# Patient Record
Sex: Female | Born: 1985
Health system: Southern US, Community
[De-identification: ages and names within clinical notes are randomized; demographics above are authoritative.]

## PROBLEM LIST (undated history)

## (undated) DIAGNOSIS — G43909 Migraine, unspecified, not intractable, without status migrainosus: Secondary | ICD-10-CM

## (undated) HISTORY — PX: TUBAL LIGATION: SHX77

---

## 2003-10-18 ENCOUNTER — Emergency Department (HOSPITAL_COMMUNITY): Admission: EM | Admit: 2003-10-18 | Discharge: 2003-10-18 | Payer: Self-pay | Admitting: Emergency Medicine

## 2010-12-11 ENCOUNTER — Emergency Department (HOSPITAL_COMMUNITY): Payer: No Typology Code available for payment source

## 2010-12-11 ENCOUNTER — Emergency Department (HOSPITAL_COMMUNITY)
Admission: EM | Admit: 2010-12-11 | Discharge: 2010-12-11 | Disposition: A | Discharge status: Home / Self Care | Payer: No Typology Code available for payment source | Attending: Emergency Medicine | Admitting: Emergency Medicine

## 2010-12-11 DIAGNOSIS — S8010XA Contusion of unspecified lower leg, initial encounter: Secondary | ICD-10-CM | POA: Insufficient documentation

## 2010-12-11 DIAGNOSIS — Y998 Other external cause status: Secondary | ICD-10-CM | POA: Insufficient documentation

## 2010-12-11 DIAGNOSIS — S40029A Contusion of unspecified upper arm, initial encounter: Secondary | ICD-10-CM | POA: Insufficient documentation

## 2010-12-11 DIAGNOSIS — Y92410 Unspecified street and highway as the place of occurrence of the external cause: Secondary | ICD-10-CM | POA: Insufficient documentation

## 2010-12-11 DIAGNOSIS — S1093XA Contusion of unspecified part of neck, initial encounter: Secondary | ICD-10-CM | POA: Insufficient documentation

## 2010-12-11 DIAGNOSIS — S0003XA Contusion of scalp, initial encounter: Secondary | ICD-10-CM | POA: Insufficient documentation

## 2011-08-12 ENCOUNTER — Emergency Department (HOSPITAL_BASED_OUTPATIENT_CLINIC_OR_DEPARTMENT_OTHER)
Admission: EM | Admit: 2011-08-12 | Discharge: 2011-08-12 | Disposition: A | Discharge status: Home Health | Payer: Medicaid Other | Attending: Emergency Medicine | Admitting: Emergency Medicine

## 2011-08-12 ENCOUNTER — Encounter (HOSPITAL_BASED_OUTPATIENT_CLINIC_OR_DEPARTMENT_OTHER): Payer: Self-pay | Admitting: *Deleted

## 2011-08-12 ENCOUNTER — Emergency Department (HOSPITAL_BASED_OUTPATIENT_CLINIC_OR_DEPARTMENT_OTHER): Payer: Medicaid Other

## 2011-08-12 DIAGNOSIS — F17200 Nicotine dependence, unspecified, uncomplicated: Secondary | ICD-10-CM | POA: Insufficient documentation

## 2011-08-12 DIAGNOSIS — R070 Pain in throat: Secondary | ICD-10-CM | POA: Insufficient documentation

## 2011-08-12 DIAGNOSIS — R059 Cough, unspecified: Secondary | ICD-10-CM

## 2011-08-12 DIAGNOSIS — R05 Cough: Secondary | ICD-10-CM | POA: Insufficient documentation

## 2011-08-12 DIAGNOSIS — J029 Acute pharyngitis, unspecified: Secondary | ICD-10-CM

## 2011-08-12 HISTORY — DX: Migraine, unspecified, not intractable, without status migrainosus: G43.909

## 2011-08-12 LAB — PREGNANCY, URINE: Preg Test, Ur: NEGATIVE

## 2011-08-12 MED ORDER — HYDROCOD POLST-CHLORPHEN POLST 10-8 MG/5ML PO LQCR: 5.0000 mL | Freq: Two times a day (BID) | ORAL | Status: DC | PRN

## 2011-08-12 NOTE — ED Provider Notes (Signed)
History     CSN: [id number]  Arrival date & time 08/12/11  1437   First MD Initiated Contact with Patient 08/12/11 1521      Chief Complaint  Patient presents with  . Cough  . Sore Throat    (Consider location/radiation/quality/duration/timing/severity/associated sxs/prior treatment) Patient is a 26 y.o. female presenting with cough. The history is provided by the patient. No language interpreter was used.  Cough This is a new problem. The current episode started more than 2 days ago. The problem has not changed since onset.The cough is productive of sputum. The maximum temperature recorded prior to her arrival was 100 to 100.9 F. Associated symptoms include rhinorrhea and sore throat. Pertinent negatives include no shortness of breath and no wheezing. Treatments tried: claritin. The treatment provided no relief. She is a smoker.    Past Medical History  Diagnosis Date  . Migraines     Past Surgical History  Procedure Date  . Cesarean section   . Tubal ligation     History reviewed. No pertinent family history.  History  Substance Use Topics  . Smoking status: Current Everyday Smoker  . Smokeless tobacco: Not on file  . Alcohol Use: No    OB History    Grav Para Term Preterm Abortions TAB SAB Ect Mult Living                  Review of Systems  Constitutional: Negative.   HENT: Positive for sore throat and rhinorrhea.   Eyes: Negative.   Respiratory: Positive for cough. Negative for shortness of breath and wheezing.   Cardiovascular: Negative.     Allergies  Levaquin  Home Medications  No current outpatient prescriptions on file.  BP 121/82  Pulse 88  Temp 98.5 F (36.9 C) (Oral)  Resp 20  Ht 4\' 11"  (1.499 m)  Wt 170 lb (77.111 kg)  BMI 34.34 kg/m2  SpO2 99%  LMP 07/24/2011  Physical Exam  Nursing note and vitals reviewed. Constitutional: She appears well-developed and well-nourished.  HENT:  Right Ear: External ear normal.  Left Ear:  External ear normal.  Mouth/Throat: Posterior oropharyngeal erythema present. No oropharyngeal exudate or posterior oropharyngeal edema.  Eyes: Conjunctivae and EOM are normal.  Neck: Neck supple.  Cardiovascular: Normal rate and regular rhythm.   Pulmonary/Chest: Breath sounds normal.  Musculoskeletal: Normal range of motion.    ED Course  Procedures (including critical care time)   Labs Reviewed  PREGNANCY, URINE   Dg Chest 2 View  08/12/2011  *RADIOLOGY REPORT*  Clinical Data: Cough  CHEST - 2 VIEW  Comparison: None  Findings: The heart size and mediastinal contours are within normal limits.  Both lungs are clear.  The visualized skeletal structures are unremarkable.  IMPRESSION: Negative exam.  Original Report Authenticated By: Rosealee Albee, M.D.     1. Cough   2. Sore throat       MDM  Symptoms likely viral:don't think antibiotics are needed at this time:will send home with symptomatic relief       Teressa Lower, NP 08/12/11 1651

## 2011-08-12 NOTE — ED Provider Notes (Signed)
History/physical exam/procedure(s) were performed by non-physician practitioner and as supervising physician I was immediately available for consultation/collaboration. I have reviewed all notes and am in agreement with care and plan.   Hazel Wrinkle S Jream Broyles, MD 08/12/11 1751 

## 2011-08-12 NOTE — Discharge Instructions (Signed)
Antibiotic Nonuse  Your caregiver felt that the infection or problem was not one that would be helped with an antibiotic. Infections may be caused by viruses or bacteria. Only a caregiver can tell which one of these is the likely cause of an illness. A cold is the most common cause of infection in both adults and children. A cold is a virus. Antibiotic treatment will have no effect on a viral infection. Viruses can lead to many lost days of work caring for sick children and many missed days of school. Children may catch as many as 10 "colds" or "flus" per year during which they can be tearful, cranky, and uncomfortable. The goal of treating a virus is aimed at keeping the ill person comfortable. Antibiotics are medications used to help the body fight bacterial infections. There are relatively few types of bacteria that cause infections but there are hundreds of viruses. While both viruses and bacteria cause infection they are very different types of germs. A viral infection will typically go away by itself within 7 to 10 days. Bacterial infections may spread or get worse without antibiotic treatment. Examples of bacterial infections are:  Sore throats (like strep throat or tonsillitis).   Infection in the lung (pneumonia).   Ear and skin infections.  Examples of viral infections are:  Colds or flus.   Most coughs and bronchitis.   Sore throats not caused by Strep.   Runny noses.  It is often best not to take an antibiotic when a viral infection is the cause of the problem. Antibiotics can kill off the helpful bacteria that we have inside our body and allow harmful bacteria to start growing. Antibiotics can cause side effects such as allergies, nausea, and diarrhea without helping to improve the symptoms of the viral infection. Additionally, repeated uses of antibiotics can cause bacteria inside of our body to become resistant. That resistance can be passed onto harmful bacterial. The next time  you have an infection it may be harder to treat if antibiotics are used when they are not needed. Not treating with antibiotics allows our own immune system to develop and take care of infections more efficiently. Also, antibiotics will work better for us when they are prescribed for bacterial infections. Treatments for a child that is ill may include:  Give extra fluids throughout the day to stay hydrated.   Get plenty of rest.   Only give your child over-the-counter or prescription medicines for pain, discomfort, or fever as directed by your caregiver.   The use of a cool mist humidifier may help stuffy noses.   Cold medications if suggested by your caregiver.  Your caregiver may decide to start you on an antibiotic if:  The problem you were seen for today continues for a longer length of time than expected.   You develop a secondary bacterial infection.  SEEK MEDICAL CARE IF:  Fever lasts longer than 5 days.   Symptoms continue to get worse after 5 to 7 days or become severe.   Difficulty in breathing develops.   Signs of dehydration develop (poor drinking, rare urinating, dark colored urine).   Changes in behavior or worsening tiredness (listlessness or lethargy).  Document Released: 04/10/2001 Document Revised: 01/19/2011 Document Reviewed: 10/07/2008 Exit[employer] Patient Information 2012 [employer].Cough, Adult  A cough is a reflex that helps clear your throat and airways. It can help heal the body or may be a reaction to an irritated airway. A cough may only last 2 or 3   weeks (acute) or may last more than 8 weeks (chronic).  CAUSES Acute cough:  Viral or bacterial infections.  Chronic cough:  Infections.   Allergies.   Asthma.   Post-nasal drip.   Smoking.   Heartburn or acid reflux.   Some medicines.   Chronic lung problems (COPD).   Cancer.  SYMPTOMS   Cough.   Fever.   Chest pain.   Increased breathing rate.   High-pitched whistling sound  when breathing (wheezing).   Colored mucus that you cough up (sputum).  TREATMENT   A bacterial cough may be treated with antibiotic medicine.   A viral cough must run its course and will not respond to antibiotics.   Your caregiver may recommend other treatments if you have a chronic cough.  HOME CARE INSTRUCTIONS   Only take over-the-counter or prescription medicines for pain, discomfort, or fever as directed by your caregiver. Use cough suppressants only as directed by your caregiver.   Use a cold steam vaporizer or humidifier in your bedroom or home to help loosen secretions.   Sleep in a semi-upright position if your cough is worse at night.   Rest as needed.   Stop smoking if you smoke.  SEEK IMMEDIATE MEDICAL CARE IF:   You have pus in your sputum.   Your cough starts to worsen.   You cannot control your cough with suppressants and are losing sleep.   You begin coughing up blood.   You have difficulty breathing.   You develop pain which is getting worse or is uncontrolled with medicine.   You have a fever.  MAKE SURE YOU:   Understand these instructions.   Will watch your condition.   Will get help right away if you are not doing well or get worse.  Document Released: 07/29/2010 Document Revised: 01/19/2011 Document Reviewed: 07/29/2010 [employer] Patient Information 2012 [employer]. 

## 2011-08-12 NOTE — ED Notes (Signed)
Pt states she has had cold s/s x 3-4 days. Hurts to swallow. Prod cough with clr sputum.

## 2011-10-04 ENCOUNTER — Emergency Department (HOSPITAL_BASED_OUTPATIENT_CLINIC_OR_DEPARTMENT_OTHER)
Admission: EM | Admit: 2011-10-04 | Discharge: 2011-10-04 | Disposition: A | Discharge status: Home / Self Care | Payer: Medicaid Other | Attending: Emergency Medicine | Admitting: Emergency Medicine

## 2011-10-04 ENCOUNTER — Encounter (HOSPITAL_BASED_OUTPATIENT_CLINIC_OR_DEPARTMENT_OTHER): Payer: Self-pay

## 2011-10-04 ENCOUNTER — Emergency Department (HOSPITAL_BASED_OUTPATIENT_CLINIC_OR_DEPARTMENT_OTHER): Payer: Medicaid Other

## 2011-10-04 DIAGNOSIS — F17200 Nicotine dependence, unspecified, uncomplicated: Secondary | ICD-10-CM | POA: Insufficient documentation

## 2011-10-04 DIAGNOSIS — K5900 Constipation, unspecified: Secondary | ICD-10-CM | POA: Insufficient documentation

## 2011-10-04 LAB — URINALYSIS, ROUTINE W REFLEX MICROSCOPIC
Leukocytes, UA: NEGATIVE
Nitrite: NEGATIVE
Specific Gravity, Urine: 1.027 (ref 1.005–1.030)
Urobilinogen, UA: 0.2 mg/dL (ref 0.0–1.0)
pH: 6 (ref 5.0–8.0)

## 2011-10-04 LAB — PREGNANCY, URINE: Preg Test, Ur: NEGATIVE

## 2011-10-04 MED ORDER — POLYETHYLENE GLYCOL 3350 17 G PO PACK: 17.0000 g | PACK | Freq: Every day | ORAL | Status: AC

## 2011-10-04 NOTE — ED Notes (Signed)
abd pain, back pain, nausea x 1 week

## 2011-10-04 NOTE — ED Provider Notes (Signed)
History     CSN: [id number]  Arrival date & time 10/04/11  1558   First MD Initiated Contact with Patient 10/04/11 1708      Chief Complaint  Patient presents with  . Abdominal Pain    (Consider location/radiation/quality/duration/timing/severity/associated sxs/prior treatment) Patient is a 26 y.o. female presenting with abdominal pain. The history is provided by the patient. No language interpreter was used.  Abdominal Pain The primary symptoms of the illness include abdominal pain. Episode onset: 1 week. The onset of the illness was gradual. The problem has been gradually worsening.  The patient states that she believes she is currently not pregnant. The patient has had a change in bowel habit. Significant associated medical issues do not include PUD, GERD or diverticulitis.  Pt complains of lower back pain and abdominal pain.   Pt reports decreased bowel movements. Pt denies gyn complaints. No vomitting or diarrhea  Past Medical History  Diagnosis Date  . Migraines     Past Surgical History  Procedure Date  . Cesarean section   . Tubal ligation     No family history on file.  History  Substance Use Topics  . Smoking status: Current Everyday Smoker  . Smokeless tobacco: Not on file  . Alcohol Use: No    OB History    Grav Para Term Preterm Abortions TAB SAB Ect Mult Living                  Review of Systems  Gastrointestinal: Positive for abdominal pain.  All other systems reviewed and are negative.    Allergies  Levaquin  Home Medications   Current Outpatient Rx  Name Route Sig Dispense Refill  . CLARITIN PO Oral Take 1 tablet by mouth daily.      BP 114/77  Pulse 86  Temp 98.6 F (37 C) (Oral)  Resp 18  Ht 4\' 11"  (1.499 m)  Wt 165 lb (74.844 kg)  BMI 33.33 kg/m2  SpO2 98%  LMP 09/23/2011  Physical Exam  Nursing note and vitals reviewed. Constitutional: She is oriented to person, place, and time. She appears well-developed and  well-nourished.  HENT:  Head: Normocephalic and atraumatic.  Right Ear: External ear normal.  Left Ear: External ear normal.  Nose: Nose normal.  Mouth/Throat: Oropharynx is clear and moist.  Eyes: Conjunctivae and EOM are normal. Pupils are equal, round, and reactive to light.  Neck: Normal range of motion. Neck supple.  Cardiovascular: Normal rate and normal heart sounds.   Pulmonary/Chest: Effort normal.  Abdominal: Soft. Bowel sounds are normal.  Musculoskeletal: Normal range of motion.  Neurological: She is alert and oriented to person, place, and time.  Skin: Skin is warm.  Psychiatric: She has a normal mood and affect.    ED Course  Procedures (including critical care time)  Labs Reviewed  URINALYSIS, ROUTINE W REFLEX MICROSCOPIC - Abnormal; Notable for the following:    Color, Urine AMBER (*)  BIOCHEMICALS MAY BE AFFECTED BY COLOR   APPearance CLOUDY (*)     Bilirubin Urine SMALL (*)     Ketones, ur 15 (*)     All other components within normal limits  PREGNANCY, URINE   No results found.  I obtained a kub which shows constipation.   I counseled pt on results 1. Constipation       MDM         Elson Areas, [place] 10/04/11 2106

## 2011-10-06 NOTE — ED Provider Notes (Signed)
Medical screening examination/treatment/procedure(s) were performed by non-physician practitioner and as supervising physician I was immediately available for consultation/collaboration.  Neyland Pettengill, MD 10/06/11 0121 

## 2011-10-19 ENCOUNTER — Emergency Department (HOSPITAL_BASED_OUTPATIENT_CLINIC_OR_DEPARTMENT_OTHER)
Admission: EM | Admit: 2011-10-19 | Discharge: 2011-10-19 | Disposition: A | Discharge status: Home / Self Care | Payer: Medicaid Other | Attending: Emergency Medicine | Admitting: Emergency Medicine

## 2011-10-19 ENCOUNTER — Encounter (HOSPITAL_BASED_OUTPATIENT_CLINIC_OR_DEPARTMENT_OTHER): Payer: Self-pay | Admitting: *Deleted

## 2011-10-19 DIAGNOSIS — F17200 Nicotine dependence, unspecified, uncomplicated: Secondary | ICD-10-CM | POA: Insufficient documentation

## 2011-10-19 DIAGNOSIS — N644 Mastodynia: Secondary | ICD-10-CM

## 2011-10-19 MED ORDER — IBUPROFEN 800 MG PO TABS
800.0000 mg | ORAL_TABLET | Freq: Once | ORAL | Status: AC
  Administered 2011-10-19: 800 mg via ORAL
  Filled 2011-10-19: qty 1

## 2011-10-19 NOTE — ED Notes (Signed)
Pt declines w/c, amb to room 11 with quick steady gait smiling in nad. Pt reports one month of generalized bilateral breast "fullness, and they are sore, like when I'm about to start my period..." ekg performed while pt being triaged.

## 2011-10-19 NOTE — ED Provider Notes (Signed)
History     CSN: [id number]  Arrival date & time 10/19/11  7846   First MD Initiated Contact with Patient 10/19/11 [phone]      Chief Complaint  Patient presents with  . Breast Pain  . Chest Pain     Patient is a 26 y.o. female presenting with chest pain. The history is provided by the patient.  Chest Pain Episode onset: several weeks ago. Chest pain occurs intermittently. The chest pain is unchanged. Associated with: palpation of chest. The severity of the pain is mild. The quality of the pain is described as aching. The pain does not radiate. Chest pain is worsened by certain positions (palpation). Pertinent negatives for primary symptoms include no fever, no shortness of breath, no cough, no vomiting and no dizziness. She tried nothing for the symptoms.   pt presents with chest soreness for several weeks, now worse with recent nonproductive cough.  No dyspnea on exertion, no SOB at rest.  No syncope.  No fever.  Also reports bilateral breast tenderness, but no lumps reported.  No trauma reported  No h/o dvt/pe/cad She does smoke but denies OCP use No recent travel/surgery  Past Medical History  Diagnosis Date  . Migraines   . Migraines     Past Surgical History  Procedure Date  . Cesarean section   . Tubal ligation     History reviewed. No pertinent family history.  History  Substance Use Topics  . Smoking status: Current Everyday Smoker  . Smokeless tobacco: Not on file  . Alcohol Use: No    OB History    Grav Para Term Preterm Abortions TAB SAB Ect Mult Living                  Review of Systems  Constitutional: Negative for fever.  Respiratory: Negative for cough and shortness of breath.   Cardiovascular: Positive for chest pain.  Gastrointestinal: Negative for vomiting.  Neurological: Negative for dizziness.    Allergies  Levaquin  Home Medications   Current Outpatient Rx  Name Route Sig Dispense Refill  . CLARITIN PO Oral Take 1 tablet by mouth  daily.      BP 122/86  Pulse 79  Temp 98.2 F (36.8 C) (Oral)  Resp 18  Ht 4\' 11"  (1.499 m)  Wt 165 lb (74.844 kg)  BMI 33.33 kg/m2  SpO2 100%  LMP 09/23/2011  Physical Exam CONSTITUTIONAL: Well developed/well nourished HEAD AND FACE: Normocephalic/atraumatic EYES: EOMI/PERRL ENMT: Mucous membranes moist NECK: supple no meningeal signs SPINE:entire spine nontender CV: S1/S2 noted, no murmurs/rubs/gallops noted Chest  - mild tenderness noted just below left clavicle.  No deformity/bruising noted.  No crepitance Breast - no breast tenderness, no nipple discharge, no erythema or mass noted.  Breasts symmetric.  No deformity to either nipple  Chaperone present LUNGS: Lungs are clear to auscultation bilaterally, no apparent distress ABDOMEN: soft, nontender, no rebound or guarding GU:no cva tenderness NEURO: Pt is awake/alert, moves all extremitiesx4 EXTREMITIES: pulses normal, full ROM SKIN: warm, color normal PSYCH: no abnormalities of mood noted Lymph - no axillary lymphadenopathy ED Course  Procedures   1. Breast pain    I doubt ACS/PE/dissection (CP reproducible) Advised f/u with gynecologist for breast tenderness and advised formal breast exam by gynecologist   MDM  Nursing notes including past medical history and social history reviewed and considered in documentation Previous records reviewed and considered - recent negative pregnancy and recent negative CXR  Date: 10/19/2011  Rate: 85  Rhythm: normal sinus rhythm  QRS Axis: normal  Intervals: normal  ST/T Wave abnormalities: normal  Conduction Disutrbances:none     Joya Gaskins, MD 10/19/11 1213

## 2012-05-30 ENCOUNTER — Encounter (HOSPITAL_BASED_OUTPATIENT_CLINIC_OR_DEPARTMENT_OTHER): Payer: Self-pay | Admitting: Emergency Medicine

## 2012-05-30 ENCOUNTER — Emergency Department (HOSPITAL_BASED_OUTPATIENT_CLINIC_OR_DEPARTMENT_OTHER): Payer: Medicaid Other

## 2012-05-30 ENCOUNTER — Emergency Department (HOSPITAL_BASED_OUTPATIENT_CLINIC_OR_DEPARTMENT_OTHER)
Admission: EM | Admit: 2012-05-30 | Discharge: 2012-05-30 | Disposition: A | Discharge status: Home / Self Care | Payer: Medicaid Other | Attending: Emergency Medicine | Admitting: Emergency Medicine

## 2012-05-30 DIAGNOSIS — Z8679 Personal history of other diseases of the circulatory system: Secondary | ICD-10-CM | POA: Insufficient documentation

## 2012-05-30 DIAGNOSIS — J3489 Other specified disorders of nose and nasal sinuses: Secondary | ICD-10-CM | POA: Insufficient documentation

## 2012-05-30 DIAGNOSIS — R6889 Other general symptoms and signs: Secondary | ICD-10-CM | POA: Insufficient documentation

## 2012-05-30 DIAGNOSIS — R05 Cough: Secondary | ICD-10-CM | POA: Insufficient documentation

## 2012-05-30 DIAGNOSIS — R5381 Other malaise: Secondary | ICD-10-CM | POA: Insufficient documentation

## 2012-05-30 DIAGNOSIS — R059 Cough, unspecified: Secondary | ICD-10-CM | POA: Insufficient documentation

## 2012-05-30 DIAGNOSIS — R110 Nausea: Secondary | ICD-10-CM | POA: Insufficient documentation

## 2012-05-30 DIAGNOSIS — F17200 Nicotine dependence, unspecified, uncomplicated: Secondary | ICD-10-CM | POA: Insufficient documentation

## 2012-05-30 DIAGNOSIS — Z79899 Other long term (current) drug therapy: Secondary | ICD-10-CM | POA: Insufficient documentation

## 2012-05-30 DIAGNOSIS — R079 Chest pain, unspecified: Secondary | ICD-10-CM | POA: Insufficient documentation

## 2012-05-30 DIAGNOSIS — R0982 Postnasal drip: Secondary | ICD-10-CM | POA: Insufficient documentation

## 2012-05-30 DIAGNOSIS — J029 Acute pharyngitis, unspecified: Secondary | ICD-10-CM | POA: Insufficient documentation

## 2012-05-30 DIAGNOSIS — J069 Acute upper respiratory infection, unspecified: Secondary | ICD-10-CM

## 2012-05-30 MED ORDER — DEXAMETHASONE SODIUM PHOSPHATE 10 MG/ML IJ SOLN
10.0000 mg | Freq: Once | INTRAMUSCULAR | Status: AC
  Administered 2012-05-30: 10 mg via INTRAMUSCULAR
  Filled 2012-05-30: qty 1

## 2012-05-30 NOTE — ED Notes (Signed)
MD at bedside. 

## 2012-05-30 NOTE — ED Provider Notes (Signed)
History     CSN: [id number]  Arrival date & time 05/30/12  1722   First MD Initiated Contact with Patient 05/30/12 1734      Chief Complaint  Patient presents with  . Headache  . Nasal Congestion  . Chest Pain    (Consider location/radiation/quality/duration/timing/severity/associated sxs/prior treatment) HPI Comments: Patient has a two-week history of allergy type symptoms with clear rhinorrhea, sneezing and nasal congestion. Over the last 2 days she's been feeling worse with increased fatigue, sore throat and increased pressure in her sinuses. She also has a cough which is primarily nonproductive. She feels sore across her chest. She has no shortness of breath. She has no pleuritic type chest pain. She denies any leg pain or swelling. She denies he fevers or chills. She's had some nausea but no vomiting. She's been taking Allegra daily without relief.  Patient is a 27 y.o. female presenting with headaches and chest pain.  Headache Associated symptoms: congestion, cough, drainage, nausea and sore throat   Associated symptoms: no abdominal pain, no back pain, no diarrhea, no dizziness, no fatigue, no fever, no numbness and no vomiting   Chest Pain Associated symptoms: cough, headache and nausea   Associated symptoms: no abdominal pain, no back pain, no diaphoresis, no dizziness, no dysphagia, no fatigue, no fever, no numbness, no shortness of breath, not vomiting and no weakness     Past Medical History  Diagnosis Date  . Migraines   . Migraines     Past Surgical History  Procedure Laterality Date  . Cesarean section    . Tubal ligation      No family history on file.  History  Substance Use Topics  . Smoking status: Current Every Day Smoker -- 0.25 packs/day  . Smokeless tobacco: Not on file  . Alcohol Use: No    OB History   Grav Para Term Preterm Abortions TAB SAB Ect Mult Living                  Review of Systems  Constitutional: Negative for fever, chills,  diaphoresis and fatigue.  HENT: Positive for congestion, sore throat, rhinorrhea, sneezing and postnasal drip. Negative for trouble swallowing.   Eyes: Negative.   Respiratory: Positive for cough. Negative for chest tightness and shortness of breath.   Cardiovascular: Positive for chest pain. Negative for leg swelling.  Gastrointestinal: Positive for nausea. Negative for vomiting, abdominal pain, diarrhea and blood in stool.  Genitourinary: Negative for frequency, hematuria, flank pain and difficulty urinating.  Musculoskeletal: Negative for back pain and arthralgias.  Skin: Negative for rash.  Neurological: Positive for headaches. Negative for dizziness, speech difficulty, weakness and numbness.    Allergies  Levaquin  Home Medications   Current Outpatient Rx  Name  Route  Sig  Dispense  Refill  . fexofenadine (ALLEGRA) 180 MG tablet   Oral   Take 180 mg by mouth daily.         . Loratadine (CLARITIN PO)   Oral   Take 1 tablet by mouth daily.           BP 117/74  Pulse 75  Temp(Src) 98.9 F (37.2 C) (Oral)  Resp 16  Ht 4\' 11"  (1.499 m)  Wt 166 lb 9.6 oz (75.569 kg)  BMI 33.63 kg/m2  SpO2 98%  LMP 05/04/2012  Physical Exam  Constitutional: She is oriented to person, place, and time. She appears well-developed and well-nourished.  HENT:  Head: Normocephalic and atraumatic.  Right Ear: External ear  normal.  Left Ear: External ear normal.  Mouth/Throat: Oropharynx is clear and moist.  No pharyngeal erythema or exudate  Eyes: Pupils are equal, round, and reactive to light.  Neck: Normal range of motion. Neck supple.  Cardiovascular: Normal rate, regular rhythm and normal heart sounds.   Pulmonary/Chest: Effort normal and breath sounds normal. No respiratory distress. She has no wheezes. She has no rales. She exhibits tenderness (chest pain is reproducible on palpation across the chest).  Abdominal: Soft. Bowel sounds are normal. There is no tenderness. There is no  rebound and no guarding.  Musculoskeletal: Normal range of motion. She exhibits no edema and no tenderness.  Negative Homans sign  Lymphadenopathy:    She has no cervical adenopathy.  Neurological: She is alert and oriented to person, place, and time.  Skin: Skin is warm and dry. No rash noted.  Psychiatric: She has a normal mood and affect.    ED Course  Procedures (including critical care time)  Dg Chest 2 View  05/30/2012  *RADIOLOGY REPORT*  Clinical Data: Cough, congestion, headache  CHEST - 2 VIEW  Comparison: Chest x-ray of 08/12/2011  Findings: No active infiltrate or effusion is seen.  Mediastinal contours are stable.  The heart is within normal limits in size. No acute bony abnormality is seen.  IMPRESSION: Stable chest x-ray.  No active lung disease.   Original Report Authenticated By: Dwyane Dee, M.D.       1. URI (upper respiratory infection)       MDM  Patient is well-appearing with upper respiratory symptoms associated with cough and chest tenderness. There is no evidence of pneumonia. She has no symptoms suggestive of pulmonary embolus. She was discharged home in good condition. She was given a shot of Decadron for symptomatic relief of her facial pressure. Given that this is only been gone on for 2-3 days I do not feel that antibiotics are indicated. I advised her to followup with her primary care physician or return to the ER if her symptoms worsen or are not improving.        Rolan Bucco, MD 05/30/12 [phone]

## 2012-05-30 NOTE — ED Notes (Signed)
Runny nose, "not feeling well", fatigue, constant chest pressure x3-4 days.

## 2012-06-07 ENCOUNTER — Emergency Department (HOSPITAL_BASED_OUTPATIENT_CLINIC_OR_DEPARTMENT_OTHER)
Admission: EM | Admit: 2012-06-07 | Discharge: 2012-06-08 | Disposition: A | Discharge status: Home / Self Care | Payer: Medicaid Other | Attending: Emergency Medicine | Admitting: Emergency Medicine

## 2012-06-07 ENCOUNTER — Emergency Department (HOSPITAL_BASED_OUTPATIENT_CLINIC_OR_DEPARTMENT_OTHER): Payer: Medicaid Other

## 2012-06-07 ENCOUNTER — Encounter (HOSPITAL_BASED_OUTPATIENT_CLINIC_OR_DEPARTMENT_OTHER): Payer: Self-pay | Admitting: *Deleted

## 2012-06-07 DIAGNOSIS — R05 Cough: Secondary | ICD-10-CM | POA: Insufficient documentation

## 2012-06-07 DIAGNOSIS — Z8679 Personal history of other diseases of the circulatory system: Secondary | ICD-10-CM | POA: Insufficient documentation

## 2012-06-07 DIAGNOSIS — J40 Bronchitis, not specified as acute or chronic: Secondary | ICD-10-CM

## 2012-06-07 DIAGNOSIS — R059 Cough, unspecified: Secondary | ICD-10-CM | POA: Insufficient documentation

## 2012-06-07 DIAGNOSIS — R0789 Other chest pain: Secondary | ICD-10-CM | POA: Insufficient documentation

## 2012-06-07 DIAGNOSIS — Z79899 Other long term (current) drug therapy: Secondary | ICD-10-CM | POA: Insufficient documentation

## 2012-06-07 DIAGNOSIS — J209 Acute bronchitis, unspecified: Secondary | ICD-10-CM | POA: Insufficient documentation

## 2012-06-07 DIAGNOSIS — J3489 Other specified disorders of nose and nasal sinuses: Secondary | ICD-10-CM | POA: Insufficient documentation

## 2012-06-07 DIAGNOSIS — F17200 Nicotine dependence, unspecified, uncomplicated: Secondary | ICD-10-CM | POA: Insufficient documentation

## 2012-06-07 LAB — BASIC METABOLIC PANEL
CO2: 25 mEq/L (ref 19–32)
Calcium: 9.9 mg/dL (ref 8.4–10.5)
GFR calc non Af Amer: >90 mL/min (ref >90)
Glucose, Bld: 86 mg/dL (ref 70–99)
Potassium: 3.8 mEq/L (ref 3.5–5.1)
Sodium: 139 mEq/L (ref 135–145)

## 2012-06-07 LAB — CBC WITH DIFFERENTIAL/PLATELET
Basophils Absolute: 0 10*3/uL (ref 0.0–0.1)
Eosinophils Absolute: 0.1 10*3/uL (ref 0.0–0.7)
Lymphocytes Relative: 57 % — ABNORMAL HIGH (ref 12–46)
Lymphs Abs: 3 10*3/uL (ref 0.7–4.0)
Neutrophils Relative %: 35 % — ABNORMAL LOW (ref 43–77)
Platelets: 194 10*3/uL (ref 150–400)
RBC: 4.5 MIL/uL (ref 3.87–5.11)
RDW: 11.9 % (ref 11.5–15.5)
WBC: 5.2 10*3/uL (ref 4.0–10.5)

## 2012-06-07 LAB — TROPONIN I: Troponin I: <0.3 ng/mL (ref <0.30)

## 2012-06-07 MED ORDER — KETOROLAC TROMETHAMINE 30 MG/ML IJ SOLN
30.0000 mg | Freq: Once | INTRAMUSCULAR | Status: AC
  Administered 2012-06-07: 30 mg via INTRAVENOUS
  Filled 2012-06-07: qty 1

## 2012-06-07 NOTE — ED Provider Notes (Addendum)
History     CSN: [id number]  Arrival date & time 06/07/12  2119   First MD Initiated Contact with Patient 06/07/12 2324      Chief Complaint  Patient presents with  . Chest Pain    (Consider location/radiation/quality/duration/timing/severity/associated sxs/prior treatment) Patient is a 27 y.o. female presenting with chest pain. The history is provided by the patient.  Chest Pain Pain location:  Substernal area Pain quality: tightness   Pain radiates to:  Does not radiate Pain radiates to the back: no   Pain severity:  Moderate Onset quality:  Gradual Duration:  7 days Timing:  Constant Progression:  Unchanged Chronicity:  Recurrent Context: no stress   Relieved by:  Nothing Worsened by:  Nothing tried Ineffective treatments:  None tried Associated symptoms: cough   Associated symptoms: no palpitations   Cough:    Cough characteristics:  Non-productive   Severity:  Moderate   Onset quality:  Gradual   Duration:  1 week   Timing:  Intermittent Risk factors: smoking     Past Medical History  Diagnosis Date  . Migraines   . Migraines     Past Surgical History  Procedure Laterality Date  . Cesarean section    . Tubal ligation      No family history on file.  History  Substance Use Topics  . Smoking status: Current Every Day Smoker -- 0.25 packs/day    Types: Cigarettes  . Smokeless tobacco: Not on file  . Alcohol Use: No    OB History   Grav Para Term Preterm Abortions TAB SAB Ect Mult Living                  Review of Systems  HENT: Positive for congestion and rhinorrhea.   Respiratory: Positive for cough and chest tightness. Negative for wheezing.   Cardiovascular: Positive for chest pain. Negative for palpitations and leg swelling.  All other systems reviewed and are negative.    Allergies  Levaquin  Home Medications   Current Outpatient Rx  Name  Route  Sig  Dispense  Refill  . fexofenadine (ALLEGRA) 180 MG tablet   Oral   Take  180 mg by mouth daily.         . Loratadine (CLARITIN PO)   Oral   Take 1 tablet by mouth daily.           BP 103/91  Pulse 75  Temp(Src) 98.6 F (37 C) (Oral)  Resp 20  Wt 166 lb (75.297 kg)  BMI 33.51 kg/m2  SpO2 99%  LMP 05/04/2012  Physical Exam  Constitutional: She is oriented to person, place, and time. She appears well-developed and well-nourished. No distress.  HENT:  Head: Normocephalic and atraumatic.  Mouth/Throat: Oropharynx is clear and moist.  Eyes: Conjunctivae are normal. Pupils are equal, round, and reactive to light.  Neck: Normal range of motion. Neck supple.  Cardiovascular: Normal rate, regular rhythm and intact distal pulses.   Pulmonary/Chest: Effort normal and breath sounds normal. She has no wheezes. She has no rales.  Abdominal: Soft. Bowel sounds are normal. There is no tenderness. There is no rebound and no guarding.  Musculoskeletal: Normal range of motion. She exhibits no edema and no tenderness.  Neurological: She is alert and oriented to person, place, and time.  Skin: Skin is warm and dry.  Psychiatric: She has a normal mood and affect.    ED Course  Procedures (including critical care time)  Labs Reviewed  CBC WITH  DIFFERENTIAL - Abnormal; Notable for the following:    Neutrophils Relative 35 (*)    Lymphocytes Relative 57 (*)    All other components within normal limits  BASIC METABOLIC PANEL  TROPONIN I   No results found.   No diagnosis found.   PERC negative wells 0 MDM   Date: 06/07/2012  Rate: 79  Rhythm: normal sinus rhythm  QRS Axis: normal  Intervals: normal  ST/T Wave abnormalities: normal  Conduction Disutrbances: none  Narrative Interpretation: unremarkable       In the setting of > 8 hrs of symptoms with negative EKG and troponin ACS excluded.  Patient states this is like her previous bronchitis.  Will treat for same.  Have provided inhaler and will prescribe z pak     Milliani Herrada K Keyon Liller-Rasch,  MD 06/08/12 0004  Kathryne Ramella Smitty Cords, MD 06/08/12 1610

## 2012-06-07 NOTE — ED Notes (Signed)
Chest pain sob on and off for a week. Was seen here for same earlier in the week. Was told she might have a virus. She is unable to sleep at night due to pain worsening with laying down and she has a feeling like she is going to stop breathing. Able to speak in complete sentences. Ambulatory without sob.

## 2012-06-07 NOTE — ED Notes (Signed)
MD at bedside. 

## 2012-06-08 MED ORDER — ALBUTEROL SULFATE HFA 108 (90 BASE) MCG/ACT IN AERS: 1.0000 | INHALATION_SPRAY | Freq: Four times a day (QID) | RESPIRATORY_TRACT | Status: DC | PRN

## 2012-06-08 MED ORDER — ALBUTEROL SULFATE HFA 108 (90 BASE) MCG/ACT IN AERS
INHALATION_SPRAY | RESPIRATORY_TRACT | Status: AC
  Administered 2012-06-08: 2 via RESPIRATORY_TRACT
  Filled 2012-06-08: qty 6.7

## 2012-06-08 MED ORDER — AZITHROMYCIN 250 MG PO TABS: ORAL_TABLET | ORAL | Status: DC

## 2012-12-09 ENCOUNTER — Emergency Department (HOSPITAL_BASED_OUTPATIENT_CLINIC_OR_DEPARTMENT_OTHER)
Admission: EM | Admit: 2012-12-09 | Discharge: 2012-12-09 | Disposition: A | Discharge status: Home / Self Care | Payer: Medicaid Other | Attending: Emergency Medicine | Admitting: Emergency Medicine

## 2012-12-09 ENCOUNTER — Encounter (HOSPITAL_BASED_OUTPATIENT_CLINIC_OR_DEPARTMENT_OTHER): Payer: Self-pay | Admitting: Emergency Medicine

## 2012-12-09 DIAGNOSIS — F17200 Nicotine dependence, unspecified, uncomplicated: Secondary | ICD-10-CM | POA: Insufficient documentation

## 2012-12-09 DIAGNOSIS — Z791 Long term (current) use of non-steroidal anti-inflammatories (NSAID): Secondary | ICD-10-CM | POA: Insufficient documentation

## 2012-12-09 DIAGNOSIS — H9209 Otalgia, unspecified ear: Secondary | ICD-10-CM | POA: Insufficient documentation

## 2012-12-09 DIAGNOSIS — Z8679 Personal history of other diseases of the circulatory system: Secondary | ICD-10-CM | POA: Insufficient documentation

## 2012-12-09 DIAGNOSIS — R0789 Other chest pain: Secondary | ICD-10-CM | POA: Insufficient documentation

## 2012-12-09 DIAGNOSIS — Z792 Long term (current) use of antibiotics: Secondary | ICD-10-CM | POA: Insufficient documentation

## 2012-12-09 DIAGNOSIS — Z79899 Other long term (current) drug therapy: Secondary | ICD-10-CM | POA: Insufficient documentation

## 2012-12-09 DIAGNOSIS — R6889 Other general symptoms and signs: Secondary | ICD-10-CM | POA: Insufficient documentation

## 2012-12-09 DIAGNOSIS — J069 Acute upper respiratory infection, unspecified: Secondary | ICD-10-CM

## 2012-12-09 LAB — RAPID STREP SCREEN (MED CTR MEBANE ONLY): Streptococcus, Group A Screen (Direct): NEGATIVE

## 2012-12-09 MED ORDER — GUAIFENESIN ER 600 MG PO TB12: 1200.0000 mg | ORAL_TABLET | Freq: Two times a day (BID) | ORAL | Status: DC

## 2012-12-09 MED ORDER — IBUPROFEN 800 MG PO TABS: 800.0000 mg | ORAL_TABLET | Freq: Three times a day (TID) | ORAL | Status: DC

## 2012-12-09 NOTE — ED Notes (Signed)
Right ear pain, sore throat, cough and chest soreness x 3 days.

## 2012-12-09 NOTE — Discharge Instructions (Signed)
Return to the ED with any concerns including difficulty breathing or swallowing, vomiting and not able to keep down liquids, decreased level of alertness/lethargy, or any other alarming symptoms °

## 2012-12-09 NOTE — ED Provider Notes (Signed)
CSN: [id number]     Arrival date & time 12/09/12  1615 History   First MD Initiated Contact with Patient 12/09/12 1729      This chart was scribed for Ethelda Chick, MD by Arlan Organ, ED Scribe. This patient was seen in room MH12/MH12 and the patient's care was started 6:11 PM.   Chief Complaint  Patient presents with  . Sore Throat   Patient is a 27 y.o. female presenting with pharyngitis. The history is provided by the patient. No language interpreter was used.  Sore Throat The current episode started more than 2 days ago. The problem occurs constantly. The problem has not changed since onset.Nothing aggravates the symptoms. Nothing relieves the symptoms.   HPI Comments: Tara Cherry is a 27 y.o. female who presents to the Emergency Department complaining of a sore throat that started 3 days ago. Pt also reports associated right ear pain, cough, and chest tenderness. Pt describes the feeling as "a rock being stuck" in her throat. She says she has been using throat spray with no relief.  Past Medical History  Diagnosis Date  . Migraines   . Migraines    Past Surgical History  Procedure Laterality Date  . Cesarean section    . Tubal ligation     No family history on file. History  Substance Use Topics  . Smoking status: Current Every Day Smoker -- 0.25 packs/day    Types: Cigarettes  . Smokeless tobacco: Not on file  . Alcohol Use: No   OB History   Grav Para Term Preterm Abortions TAB SAB Ect Mult Living                 Review of Systems  HENT: Positive for sore throat.   Respiratory: Positive for cough.   All other systems reviewed and are negative.    Allergies  Levaquin  Home Medications   Current Outpatient Rx  Name  Route  Sig  Dispense  Refill  . albuterol (PROVENTIL HFA;VENTOLIN HFA) 108 (90 BASE) MCG/ACT inhaler   Inhalation   Inhale 1-2 puffs into the lungs every 6 (six) hours as needed for wheezing.   1 Inhaler   0   . azithromycin  (ZITHROMAX Z-PAK) 250 MG tablet      2 po day one, then 1 daily x 4 days   5 tablet   0   . fexofenadine (ALLEGRA) 180 MG tablet   Oral   Take 180 mg by mouth daily.         Marland Kitchen guaiFENesin (MUCINEX) 600 MG 12 hr tablet   Oral   Take 2 tablets (1,200 mg total) by mouth 2 (two) times daily.   20 tablet   0   . ibuprofen (ADVIL,MOTRIN) 800 MG tablet   Oral   Take 1 tablet (800 mg total) by mouth 3 (three) times daily.   21 tablet   0   . Loratadine (CLARITIN PO)   Oral   Take 1 tablet by mouth daily.          BP 117/83  Pulse 85  Temp(Src) 98.6 F (37 C) (Oral)  Resp 20  Wt 166 lb (75.297 kg)  BMI 33.51 kg/m2  SpO2 100%  Physical Exam  Nursing note and vitals reviewed. Constitutional: She is oriented to person, place, and time. She appears well-developed and well-nourished.  HENT:  Head: Normocephalic and atraumatic.  Right Ear: Tympanic membrane normal.  Left Ear: Tympanic membrane normal.  moedrate erythema of  oralpharx No exudate Palate symmetric Uvula midline  Eyes: EOM are normal.  Neck: Normal range of motion.  Cardiovascular: Normal rate.   Pulmonary/Chest: Effort normal and breath sounds normal.  Musculoskeletal: Normal range of motion.  Lymphadenopathy:    She has cervical adenopathy.  Neurological: She is alert and oriented to person, place, and time.  Skin: Skin is warm and dry.  Psychiatric: She has a normal mood and affect. Her behavior is normal.    ED Course  Procedures (including critical care time)  DIAGNOSTIC STUDIES: Oxygen Saturation is 100% on RA, Normal by my interpretation.    COORDINATION OF CARE: 6:11 PM- Advised pt to try ibuprofen. Will give mucinex. Discussed treatment plan with pt at bedside and pt agreed to plan.     Labs Review Labs Reviewed  RAPID STREP SCREEN  CULTURE, GROUP A STREP   Imaging Review No results found.  EKG Interpretation   None       MDM   1. Viral URI with cough    Pt presenting  with sore throat, nasal congestion and mild cough.  Pt is overall nontoxic and well hydrated in appearance.  Lungs are clear.  Rapid strep testing negative.  Throat culture pending.  Discussed symptomatic treatment with patient- ibuprofen, drink plenty of liquids and mucinex.  Discharged with strict return precautions.  Pt agreeable with plan.  I personally performed the services described in this documentation, which was scribed in my presence. The recorded information has been reviewed and is accurate.   Ethelda Chick, MD 12/09/12 [phone]

## 2012-12-11 LAB — CULTURE, GROUP A STREP

## 2013-03-07 ENCOUNTER — Encounter (HOSPITAL_BASED_OUTPATIENT_CLINIC_OR_DEPARTMENT_OTHER): Payer: Self-pay | Admitting: Emergency Medicine

## 2013-03-07 ENCOUNTER — Emergency Department (HOSPITAL_BASED_OUTPATIENT_CLINIC_OR_DEPARTMENT_OTHER)
Admission: EM | Admit: 2013-03-07 | Discharge: 2013-03-07 | Disposition: A | Discharge status: Home / Self Care | Payer: Medicaid Other | Attending: Emergency Medicine | Admitting: Emergency Medicine

## 2013-03-07 DIAGNOSIS — Z79899 Other long term (current) drug therapy: Secondary | ICD-10-CM | POA: Insufficient documentation

## 2013-03-07 DIAGNOSIS — R591 Generalized enlarged lymph nodes: Secondary | ICD-10-CM

## 2013-03-07 DIAGNOSIS — F17200 Nicotine dependence, unspecified, uncomplicated: Secondary | ICD-10-CM | POA: Insufficient documentation

## 2013-03-07 DIAGNOSIS — Z8679 Personal history of other diseases of the circulatory system: Secondary | ICD-10-CM | POA: Insufficient documentation

## 2013-03-07 DIAGNOSIS — R599 Enlarged lymph nodes, unspecified: Secondary | ICD-10-CM | POA: Insufficient documentation

## 2013-03-07 MED ORDER — KETOROLAC TROMETHAMINE 30 MG/ML IJ SOLN
30.0000 mg | Freq: Once | INTRAMUSCULAR | Status: AC
  Administered 2013-03-07: 30 mg via INTRAMUSCULAR
  Filled 2013-03-07: qty 1

## 2013-03-07 MED ORDER — IBUPROFEN 600 MG PO TABS: 600.0000 mg | ORAL_TABLET | Freq: Four times a day (QID) | ORAL | Status: DC | PRN

## 2013-03-07 NOTE — ED Notes (Signed)
Pt reports 'painful knot' under left arm since switching razors a month ago.

## 2013-03-07 NOTE — ED Provider Notes (Signed)
CSN: [id number]     Arrival date & time 03/07/13  0803 History   First MD Initiated Contact with Patient 03/07/13 [phone]     Chief Complaint  Patient presents with  . Abscess   (Consider location/radiation/quality/duration/timing/severity/associated sxs/prior Treatment) HPI  This a 28 year old female with history of migraines who presents with swelling under the left axilla. Patient reports a one-month history of a "knot" under her armpit. Patient reports that she changed razors approximately one month ago and sustained a razor cut prior to noting a knot under her arm. She states that it is tender it has not gotten any better. She states initially she had 2 knots but one has gotten better.  Patient denies any fevers or systemic symptoms. Currently she has not taken anything at home for pain. She denies any changes in her breast and reports doing self breast exams. She denies being scratched.   Currently rates her pain at 6/10.  Past Medical History  Diagnosis Date  . Migraines   . Migraines    Past Surgical History  Procedure Laterality Date  . Cesarean section    . Tubal ligation     History reviewed. No pertinent family history. History  Substance Use Topics  . Smoking status: Current Every Day Smoker -- 0.25 packs/day    Types: Cigarettes  . Smokeless tobacco: Not on file  . Alcohol Use: No   OB History   Grav Para Term Preterm Abortions TAB SAB Ect Mult Living                 Review of Systems  Constitutional: Negative for fever.  Respiratory: Negative for chest tightness and shortness of breath.   Cardiovascular: Negative for chest pain.  Gastrointestinal: Negative for abdominal pain.  Genitourinary: Negative for dysuria.  Skin:       Knot in left axilla  Neurological: Negative for headaches.  All other systems reviewed and are negative.    Allergies  Levaquin  Home Medications   Current Outpatient Rx  Name  Route  Sig  Dispense  Refill  . albuterol  (PROVENTIL HFA;VENTOLIN HFA) 108 (90 BASE) MCG/ACT inhaler   Inhalation   Inhale 1-2 puffs into the lungs every 6 (six) hours as needed for wheezing.   1 Inhaler   0   . fexofenadine (ALLEGRA) 180 MG tablet   Oral   Take 180 mg by mouth daily.         Marland Kitchen. ibuprofen (ADVIL,MOTRIN) 600 MG tablet   Oral   Take 1 tablet (600 mg total) by mouth every 6 (six) hours as needed.   30 tablet   0   . ibuprofen (ADVIL,MOTRIN) 800 MG tablet   Oral   Take 1 tablet (800 mg total) by mouth 3 (three) times daily.   21 tablet   0   . Loratadine (CLARITIN PO)   Oral   Take 1 tablet by mouth daily.          BP 117/75  Pulse 96  Temp(Src) 98.7 F (37.1 C) (Oral)  Resp 18  SpO2 98% Physical Exam  Nursing note and vitals reviewed. Constitutional: She is oriented to person, place, and time. She appears well-developed and well-nourished. No distress.  HENT:  Head: Normocephalic and atraumatic.  Cardiovascular: Normal rate and regular rhythm.   Pulmonary/Chest: Effort normal. No respiratory distress.  Abdominal: Soft. There is no tenderness.  Musculoskeletal: She exhibits no edema.  Examination of left axilla reveals firm lymphadenopathy with tenderness to palpation.  There is approximately a 2 x 2 cm mobile lymph node.  No overlying skin changes noted.  Neurological: She is alert and oriented to person, place, and time.  Skin: Skin is warm and dry. No rash noted. No erythema.  Psychiatric: She has a normal mood and affect.    ED Course  Procedures (including critical care time) Labs Review Labs Reviewed - No data to display Imaging Review No results found.  EKG Interpretation   None      EMERGENCY DEPARTMENT US SOFT TISSUE INTERPRETATION "Study: Limited Ultrasound of the noted body part in comments below"  INDICATIONS: Pain Multiple views of the body part are obtained with a multi-frequency linear probe  PERFORMED BY:  Myself  IMAGES ARCHIVED?: No  SIDE:Left  BODY  PART:Axilla  FINDINGS: No abcess noted and Other 2 x 2 centimeter lymph node   LIMITATIONS:  Emergent Procedure  INTeRPRETATION:  No abcess noted  COMMENT:     MDM   1. Lymphadenopathy    Patient presents with left axillary lymphadenopathy. This is likely reactive in nature given recent razor injury. Lymph node is tender to palpation but without overlying skin changes. There is no obvious abscess. Patient given IM Toradol for pain.  Have encouraged patient to use cool compresses. At this time given no evidence of overlying cellulitis, will not treat with antibiotics. Patient encouraged to use anti-inflammatories at home for pain. She is to followup with her primary physician next week if symptoms persist. She is to not shave during that time. She was given strict return precautions.  After history, exam, and medical workup I feel the patient has been appropriately medically screened and is safe for discharge home. Pertinent diagnoses were discussed with the patient. Patient was given return precautions.     Shon Baton, MD 03/07/13 [phone]

## 2013-03-07 NOTE — Discharge Instructions (Signed)
Lymphadenopathy °Lymphadenopathy means "disease of the lymph glands." But the term is usually used to describe swollen or enlarged lymph glands, also called lymph nodes. These are the bean-shaped organs found in many locations including the neck, underarm, and groin. Lymph glands are part of the immune system, which fights infections in your body. Lymphadenopathy can occur in just one area of the body, such as the neck, or it can be generalized, with lymph node enlargement in several areas. The nodes found in the neck are the most common sites of lymphadenopathy. °CAUSES  °When your immune system responds to germs (such as viruses or bacteria ), infection-fighting cells and fluid build up. This causes the glands to grow in size. This is usually not something to worry about. Sometimes, the glands themselves can become infected and inflamed. This is called lymphadenitis. °Enlarged lymph nodes can be caused by many diseases: °· Bacterial disease, such as strep throat or a skin infection. °· Viral disease, such as a common cold. °· Other germs, such as lyme disease, tuberculosis, or sexually transmitted diseases. °· Cancers, such as lymphoma (cancer of the lymphatic system) or leukemia (cancer of the white blood cells). °· Inflammatory diseases such as lupus or rheumatoid arthritis. °· Reactions to medications. °Many of the diseases above are rare, but important. This is why you should see your caregiver if you have lymphadenopathy. °SYMPTOMS  °· Swollen, enlarged lumps in the neck, back of the head or other locations. °· Tenderness. °· Warmth or redness of the skin over the lymph nodes. °· Fever. °DIAGNOSIS  °Enlarged lymph nodes are often near the source of infection. They can help healthcare providers diagnose your illness. For instance:  °· Swollen lymph nodes around the jaw might be caused by an infection in the mouth. °· Enlarged glands in the neck often signal a throat infection. °· Lymph nodes that are swollen  in more than one area often indicate an illness caused by a virus. °Your caregiver most likely will know what is causing your lymphadenopathy after listening to your history and examining you. Blood tests, x-rays or other tests may be needed. If the cause of the enlarged lymph node cannot be found, and it does not go away by itself, then a biopsy may be needed. Your caregiver will discuss this with you. °TREATMENT  °Treatment for your enlarged lymph nodes will depend on the cause. Many times the nodes will shrink to normal size by themselves, with no treatment. Antibiotics or other medicines may be needed for infection. Only take over-the-counter or prescription medicines for pain, discomfort or fever as directed by your caregiver. °HOME CARE INSTRUCTIONS  °Swollen lymph glands usually return to normal when the underlying medical condition goes away. If they persist, contact your health-care provider. He/she might prescribe antibiotics or other treatments, depending on the diagnosis. Take any medications exactly as prescribed. Keep any follow-up appointments made to check on the condition of your enlarged nodes.  °SEEK MEDICAL CARE IF:  °· Swelling lasts for more than two weeks. °· You have symptoms such as weight loss, night sweats, fatigue or fever that does not go away. °· The lymph nodes are hard, seem fixed to the skin or are growing rapidly. °· Skin over the lymph nodes is red and inflamed. This could mean there is an infection. °SEEK IMMEDIATE MEDICAL CARE IF:  °· Fluid starts leaking from the area of the enlarged lymph node. °· You develop a fever of 102° F (38.9° C) or greater. °· Severe   pain develops (not necessarily at the site of a large lymph node). °· You develop chest pain or shortness of breath. °· You develop worsening abdominal pain. °MAKE SURE YOU:  °· Understand these instructions. °· Will watch your condition. °· Will get help right away if you are not doing well or get worse. °Document  Released: 11/09/2007 Document Revised: 04/24/2011 Document Reviewed: 11/09/2007 °[employer]® Patient Information ©2014 [employer]. ° °

## 2013-04-19 ENCOUNTER — Emergency Department (HOSPITAL_BASED_OUTPATIENT_CLINIC_OR_DEPARTMENT_OTHER)
Admission: EM | Admit: 2013-04-19 | Discharge: 2013-04-20 | Disposition: A | Discharge status: Home / Self Care | Payer: Medicaid Other | Attending: Emergency Medicine | Admitting: Emergency Medicine

## 2013-04-19 ENCOUNTER — Emergency Department (HOSPITAL_BASED_OUTPATIENT_CLINIC_OR_DEPARTMENT_OTHER): Payer: Medicaid Other

## 2013-04-19 ENCOUNTER — Encounter (HOSPITAL_BASED_OUTPATIENT_CLINIC_OR_DEPARTMENT_OTHER): Payer: Self-pay | Admitting: Emergency Medicine

## 2013-04-19 DIAGNOSIS — Z79899 Other long term (current) drug therapy: Secondary | ICD-10-CM | POA: Insufficient documentation

## 2013-04-19 DIAGNOSIS — H5789 Other specified disorders of eye and adnexa: Secondary | ICD-10-CM | POA: Insufficient documentation

## 2013-04-19 DIAGNOSIS — F17200 Nicotine dependence, unspecified, uncomplicated: Secondary | ICD-10-CM | POA: Insufficient documentation

## 2013-04-19 DIAGNOSIS — R062 Wheezing: Secondary | ICD-10-CM | POA: Insufficient documentation

## 2013-04-19 DIAGNOSIS — J111 Influenza due to unidentified influenza virus with other respiratory manifestations: Secondary | ICD-10-CM | POA: Insufficient documentation

## 2013-04-19 DIAGNOSIS — R6889 Other general symptoms and signs: Secondary | ICD-10-CM

## 2013-04-19 DIAGNOSIS — R110 Nausea: Secondary | ICD-10-CM

## 2013-04-19 DIAGNOSIS — Z8679 Personal history of other diseases of the circulatory system: Secondary | ICD-10-CM | POA: Insufficient documentation

## 2013-04-19 DIAGNOSIS — Z791 Long term (current) use of non-steroidal anti-inflammatories (NSAID): Secondary | ICD-10-CM | POA: Insufficient documentation

## 2013-04-19 DIAGNOSIS — H9209 Otalgia, unspecified ear: Secondary | ICD-10-CM | POA: Insufficient documentation

## 2013-04-19 DIAGNOSIS — Z3202 Encounter for pregnancy test, result negative: Secondary | ICD-10-CM | POA: Insufficient documentation

## 2013-04-19 LAB — URINALYSIS, ROUTINE W REFLEX MICROSCOPIC
BILIRUBIN URINE: NEGATIVE
Glucose, UA: NEGATIVE mg/dL
Ketones, ur: NEGATIVE mg/dL
Leukocytes, UA: NEGATIVE
NITRITE: NEGATIVE
PH: 6 (ref 5.0–8.0)
Protein, ur: NEGATIVE mg/dL
SPECIFIC GRAVITY, URINE: 1.028 (ref 1.005–1.030)
Urobilinogen, UA: 0.2 mg/dL (ref 0.0–1.0)

## 2013-04-19 LAB — URINE MICROSCOPIC-ADD ON

## 2013-04-19 LAB — PREGNANCY, URINE: PREG TEST UR: NEGATIVE

## 2013-04-19 NOTE — ED Notes (Signed)
C/o cough, congestion, sore throat, body aches, fever, and chest discomfort x 2-3 days.  C/o nausea, no vomiting/diarrhea.

## 2013-04-19 NOTE — ED Provider Notes (Signed)
CSN: [id number]     Arrival date & time 04/19/13  2110 History   First MD Initiated Contact with Patient 04/19/13 2200     Chief Complaint  Patient presents with  . Flu Like Symptoms      (Consider location/radiation/quality/duration/timing/severity/associated sxs/prior Treatment) Patient is a 28 y.o. female presenting with URI. The history is provided by the patient.  URI Presenting symptoms: congestion, cough, ear pain, fever and sore throat   Severity:  Moderate Onset quality:  Gradual Duration:  4 days Timing:  Constant Progression:  Worsening Chronicity:  New Relieved by:  Nothing Worsened by:  Nothing tried Ineffective treatments:  OTC medications Associated symptoms: headaches, myalgias, sinus pain, sneezing and wheezing   Associated symptoms: no swollen glands    Tara Cherry is a 28 y.o. female who presents to the ED with cough, cold, congestion, fever and chills that started about 4 days ago. Chest hurts with cough. She complains of crusty drainage from her eyes. She has used OTC medications without relief. She denies nausea, vomiting or diarrhea.  Past Medical History  Diagnosis Date  . Migraines   . Migraines    Past Surgical History  Procedure Laterality Date  . Cesarean section    . Tubal ligation     No family history on file. History  Substance Use Topics  . Smoking status: Current Every Day Smoker -- 0.25 packs/day    Types: Cigarettes  . Smokeless tobacco: Not on file  . Alcohol Use: No   OB History   Grav Para Term Preterm Abortions TAB SAB Ect Mult Living                 Review of Systems  Constitutional: Positive for fever and chills.  HENT: Positive for congestion, ear pain, sinus pressure, sneezing and sore throat.   Eyes: Positive for discharge, redness and itching.  Respiratory: Positive for cough and wheezing.   Gastrointestinal: Negative for nausea, vomiting and abdominal pain.  Genitourinary: Negative for dysuria, urgency and  frequency.  Musculoskeletal: Positive for myalgias.  Skin: Negative for rash.  Neurological: Positive for headaches. Negative for syncope.  Psychiatric/Behavioral: Negative for confusion. The patient is not nervous/anxious.       Allergies  Levaquin  Home Medications   Current Outpatient Rx  Name  Route  Sig  Dispense  Refill  . albuterol (PROVENTIL HFA;VENTOLIN HFA) 108 (90 BASE) MCG/ACT inhaler   Inhalation   Inhale 1-2 puffs into the lungs every 6 (six) hours as needed for wheezing.   1 Inhaler   0   . fexofenadine (ALLEGRA) 180 MG tablet   Oral   Take 180 mg by mouth daily.         Marland Kitchen. ibuprofen (ADVIL,MOTRIN) 600 MG tablet   Oral   Take 1 tablet (600 mg total) by mouth every 6 (six) hours as needed.   30 tablet   0   . ibuprofen (ADVIL,MOTRIN) 800 MG tablet   Oral   Take 1 tablet (800 mg total) by mouth 3 (three) times daily.   21 tablet   0   . Loratadine (CLARITIN PO)   Oral   Take 1 tablet by mouth daily.          BP 126/79  Pulse 79  Temp(Src) 99.2 F (37.3 C) (Oral)  Resp 20  Ht 4\' 11"  (1.499 m)  Wt 162 lb (73.483 kg)  BMI 32.70 kg/m2  SpO2 99%  LMP 04/13/2013 Physical Exam  Nursing note and vitals  reviewed. Constitutional: She is oriented to person, place, and time. She appears well-developed and well-nourished. No distress.  HENT:  Head: Normocephalic.  Right Ear: Tympanic membrane normal.  Left Ear: Tympanic membrane normal.  Nose: Mucosal edema and rhinorrhea present.  Mouth/Throat: Uvula is midline, oropharynx is clear and moist and mucous membranes are normal.  Eyes: EOM are normal.  Neck: Neck supple.  Cardiovascular: Normal rate.   Pulmonary/Chest: Effort normal. She has no wheezes. She has no rales.  Abdominal: Soft. Bowel sounds are normal. There is no tenderness.  Musculoskeletal: Normal range of motion.  Neurological: She is alert and oriented to person, place, and time. No cranial nerve deficit.  Skin: Skin is warm and dry.   Psychiatric: She has a normal mood and affect. Her behavior is normal.   Results for orders placed during the hospital encounter of 04/19/13 (from the past 24 hour(s))  PREGNANCY, URINE     Status: None   Collection Time    04/19/13 10:02 PM      Result Value Ref Range   Preg Test, Ur NEGATIVE  NEGATIVE  URINALYSIS, ROUTINE W REFLEX MICROSCOPIC     Status: Abnormal   Collection Time    04/19/13 10:02 PM      Result Value Ref Range   Color, Urine YELLOW  YELLOW   APPearance CLEAR  CLEAR   Specific Gravity, Urine 1.028  1.005 - 1.030   pH 6.0  5.0 - 8.0   Glucose, UA NEGATIVE  NEGATIVE mg/dL   Hgb urine dipstick MODERATE (*) NEGATIVE   Bilirubin Urine NEGATIVE  NEGATIVE   Ketones, ur NEGATIVE  NEGATIVE mg/dL   Protein, ur NEGATIVE  NEGATIVE mg/dL   Urobilinogen, UA 0.2  0.0 - 1.0 mg/dL   Nitrite NEGATIVE  NEGATIVE   Leukocytes, UA NEGATIVE  NEGATIVE  URINE MICROSCOPIC-ADD ON     Status: Abnormal   Collection Time    04/19/13 10:02 PM      Result Value Ref Range   Squamous Epithelial / LPF MANY (*) RARE   WBC, UA 0-2  <3 WBC/hpf   RBC / HPF 3-6  <3 RBC/hpf   Bacteria, UA MANY (*) RARE   Urine-Other MUCOUS PRESENT      ED Course  Procedures Dg Chest 2 View  04/19/2013   CLINICAL DATA:  Cough, congestion, sore throat  EXAM: CHEST  2 VIEW  COMPARISON:  06/07/2012.  FINDINGS: The heart size and mediastinal contours are within normal limits. Both lungs are clear. The visualized skeletal structures are unremarkable. No change from prior normal examination.  IMPRESSION: No active cardiopulmonary disease.   Electronically Signed   By: Davonna Belling M.D.   On: 04/19/2013 23:18    MDM  28 y.o. female with URI symptoms x 4 days. Will treat symptoms and she will follow up with her PCP. She will return here as needed for worsening symptoms. Blood in urine due to menses. Will treat her nausea with Zofran. Stable for discharge without any further screening indicated at this time.  I have  reviewed this patient's vital signs, nurses notes, appropriate labs and imaging.     Medication List    TAKE these medications       guaiFENesin-codeine 100-10 MG/5ML syrup  Commonly known as:  ROBITUSSIN AC  Take 5 mLs by mouth 3 (three) times daily as needed for cough.     ondansetron 8 MG disintegrating tablet  Commonly known as:  ZOFRAN ODT  Take 1 tablet (8  mg total) by mouth every 8 (eight) hours as needed for nausea or vomiting.      ASK your doctor about these medications       albuterol 108 (90 BASE) MCG/ACT inhaler  Commonly known as:  PROVENTIL HFA;VENTOLIN HFA  Inhale 1-2 puffs into the lungs every 6 (six) hours as needed for wheezing.     CLARITIN PO  Take 1 tablet by mouth daily.     fexofenadine 180 MG tablet  Commonly known as:  ALLEGRA  Take 180 mg by mouth daily.     ibuprofen 800 MG tablet  Commonly known as:  ADVIL,MOTRIN  Take 1 tablet (800 mg total) by mouth 3 (three) times daily.     ibuprofen 600 MG tablet  Commonly known as:  ADVIL,MOTRIN  Take 1 tablet (600 mg total) by mouth every 6 (six) hours as needed.           [facility] Orlene Och, [place] 04/20/13 0020

## 2013-04-20 MED ORDER — ONDANSETRON 8 MG PO TBDP: 8.0000 mg | ORAL_TABLET | Freq: Three times a day (TID) | ORAL | Status: DC | PRN

## 2013-04-20 MED ORDER — GUAIFENESIN-CODEINE 100-10 MG/5ML PO SYRP: 5.0000 mL | ORAL_SOLUTION | Freq: Three times a day (TID) | ORAL | Status: DC | PRN

## 2013-04-20 NOTE — Discharge Instructions (Signed)
Your chest x-ray today is normal. Your urine shows that you are not dehydrated. Your exam shows that you have viral illness. Take the cough medication as needed. Do not take it if you are driving as it will make you sleepy. Be sure you are drinking plenty of fluids to prevent dehydration. Rest and take tylenol or ibuprofen as needed for fever and aching.

## 2013-04-20 NOTE — ED Provider Notes (Signed)
Medical screening examination/treatment/procedure(s) were performed by non-physician practitioner and as supervising physician I was immediately available for consultation/collaboration.   EKG Interpretation None       Raymie Trani, MD 04/20/13 1459 

## 2013-08-22 ENCOUNTER — Emergency Department (HOSPITAL_BASED_OUTPATIENT_CLINIC_OR_DEPARTMENT_OTHER)
Admission: EM | Admit: 2013-08-22 | Discharge: 2013-08-22 | Disposition: A | Discharge status: Home / Self Care | Payer: Medicaid Other | Attending: Emergency Medicine | Admitting: Emergency Medicine

## 2013-08-22 ENCOUNTER — Emergency Department (HOSPITAL_BASED_OUTPATIENT_CLINIC_OR_DEPARTMENT_OTHER): Payer: Medicaid Other

## 2013-08-22 ENCOUNTER — Encounter (HOSPITAL_BASED_OUTPATIENT_CLINIC_OR_DEPARTMENT_OTHER): Payer: Self-pay | Admitting: Emergency Medicine

## 2013-08-22 DIAGNOSIS — F17200 Nicotine dependence, unspecified, uncomplicated: Secondary | ICD-10-CM | POA: Diagnosis not present

## 2013-08-22 DIAGNOSIS — G43909 Migraine, unspecified, not intractable, without status migrainosus: Secondary | ICD-10-CM | POA: Diagnosis not present

## 2013-08-22 DIAGNOSIS — Z79899 Other long term (current) drug therapy: Secondary | ICD-10-CM | POA: Diagnosis not present

## 2013-08-22 DIAGNOSIS — Z791 Long term (current) use of non-steroidal anti-inflammatories (NSAID): Secondary | ICD-10-CM | POA: Insufficient documentation

## 2013-08-22 DIAGNOSIS — J069 Acute upper respiratory infection, unspecified: Secondary | ICD-10-CM | POA: Diagnosis not present

## 2013-08-22 DIAGNOSIS — R0789 Other chest pain: Secondary | ICD-10-CM

## 2013-08-22 DIAGNOSIS — R079 Chest pain, unspecified: Secondary | ICD-10-CM | POA: Diagnosis present

## 2013-08-22 DIAGNOSIS — E663 Overweight: Secondary | ICD-10-CM | POA: Insufficient documentation

## 2013-08-22 DIAGNOSIS — R071 Chest pain on breathing: Secondary | ICD-10-CM | POA: Diagnosis not present

## 2013-08-22 MED ORDER — IBUPROFEN 600 MG PO TABS
600.0000 mg | ORAL_TABLET | Freq: Four times a day (QID) | ORAL | Status: DC | PRN
Start: 2013-08-22 — End: 2015-09-13

## 2013-08-22 MED ORDER — ALBUTEROL SULFATE HFA 108 (90 BASE) MCG/ACT IN AERS: 2.0000 | INHALATION_SPRAY | RESPIRATORY_TRACT | Status: DC | PRN

## 2013-08-22 MED ORDER — PREDNISONE 20 MG PO TABS: 60.0000 mg | ORAL_TABLET | Freq: Once | ORAL | Status: DC

## 2013-08-22 MED ORDER — KETOROLAC TROMETHAMINE 60 MG/2ML IM SOLN
60.0000 mg | Freq: Once | INTRAMUSCULAR | Status: AC
  Administered 2013-08-22: 60 mg via INTRAMUSCULAR
  Filled 2013-08-22: qty 2

## 2013-08-22 MED ORDER — PREDNISONE 50 MG PO TABS
60.0000 mg | ORAL_TABLET | Freq: Once | ORAL | Status: AC
  Administered 2013-08-22: 60 mg via ORAL
  Filled 2013-08-22 (×2): qty 1

## 2013-08-22 NOTE — ED Notes (Signed)
MD at bedside. 

## 2013-08-22 NOTE — ED Notes (Signed)
C/o chest pain x 3 days. Also c/o sob. Pt states she started sinus infection with sinus pressure and pain. Pt states her sob started after this with being unable to lie back  And breathe easily. Pt states her pain is worse with pushing on chest and coughing. States pain is a constant pain 7/10 at rest.

## 2013-08-22 NOTE — ED Provider Notes (Signed)
CSN: [id number]     Arrival date & time 08/22/13  1254 History   First MD Initiated Contact with Patient 08/22/13 1309     Chief Complaint  Patient presents with  . Chest Pain     (Consider location/radiation/quality/duration/timing/severity/associated sxs/prior Treatment) HPI  This a 28 year old female with a history of migraines who presents with chest pain, and cough. Patient reports symptoms over the last 3 days. Cough is productive of clear sputum. She denies any fevers. She reports generalized malaise. She is a current smoker but states he's been smoking less over the last several days. Was recently diagnosed with bronchitis. Patient states chest pain is worse with cough. She denies any lower extremity swelling, history of blood clots, estrogen use.  No known sick contacts.  Patient states that she has been taking an albuterol inhaler and ibuprofen which does help some.  Past Medical History  Diagnosis Date  . Migraines   . Migraines    Past Surgical History  Procedure Laterality Date  . Cesarean section    . Tubal ligation     No family history on file. History  Substance Use Topics  . Smoking status: Current Every Day Smoker -- 0.25 packs/day    Types: Cigarettes  . Smokeless tobacco: Not on file  . Alcohol Use: No   OB History   Grav Para Term Preterm Abortions TAB SAB Ect Mult Living                 Review of Systems  Constitutional: Positive for fatigue. Negative for fever.  Respiratory: Positive for cough, chest tightness and shortness of breath. Negative for wheezing.   Cardiovascular: Positive for chest pain. Negative for palpitations and leg swelling.  Gastrointestinal: Negative for nausea, vomiting and abdominal pain.  Genitourinary: Negative for dysuria.  Skin: Negative for rash.  Neurological: Negative for headaches.  All other systems reviewed and are negative.     Allergies  Levaquin  Home Medications   Prior to Admission medications    Medication Sig Start Date End Date Taking? Authorizing Provider  albuterol (PROVENTIL HFA;VENTOLIN HFA) 108 (90 BASE) MCG/ACT inhaler Inhale 1-2 puffs into the lungs every 6 (six) hours as needed for wheezing. 06/08/12   April K Palumbo-Rasch, MD  albuterol (PROVENTIL HFA;VENTOLIN HFA) 108 (90 BASE) MCG/ACT inhaler Inhale 2 puffs into the lungs every 4 (four) hours as needed for wheezing or shortness of breath. 08/22/13   Shon Baton, MD  fexofenadine (ALLEGRA) 180 MG tablet Take 180 mg by mouth daily.    Historical Provider, MD  guaiFENesin-codeine (ROBITUSSIN AC) 100-10 MG/5ML syrup Take 5 mLs by mouth 3 (three) times daily as needed for cough. 04/20/13   Hope Orlene Och, NP  ibuprofen (ADVIL,MOTRIN) 600 MG tablet Take 1 tablet (600 mg total) by mouth every 6 (six) hours as needed. 03/07/13   Shon Baton, MD  ibuprofen (ADVIL,MOTRIN) 600 MG tablet Take 1 tablet (600 mg total) by mouth every 6 (six) hours as needed. 08/22/13   Shon Baton, MD  ibuprofen (ADVIL,MOTRIN) 800 MG tablet Take 1 tablet (800 mg total) by mouth 3 (three) times daily. 12/09/12   Ethelda Chick, MD  Loratadine (CLARITIN PO) Take 1 tablet by mouth daily.    Historical Provider, MD  ondansetron (ZOFRAN ODT) 8 MG disintegrating tablet Take 1 tablet (8 mg total) by mouth every 8 (eight) hours as needed for nausea or vomiting. 04/20/13   Hope Orlene Och, NP  predniSONE (DELTASONE) 20 MG tablet  Take 3 tablets (60 mg total) by mouth once. 08/22/13   Shon Batonourtney F Harshaan Whang, MD   BP 120/71  Pulse 86  Temp(Src) 98.7 F (37.1 C) (Oral)  Resp 18  Ht 4\' 11"  (1.499 m)  Wt 160 lb (72.576 kg)  BMI 32.30 kg/m2  SpO2 100%  LMP 08/16/2013 Physical Exam  Nursing note and vitals reviewed. Constitutional: She is oriented to person, place, and time. She appears well-developed and well-nourished. No distress.  Overweight  HENT:  Head: Normocephalic and atraumatic.  Mouth/Throat: Oropharynx is clear and moist.  Eyes: Pupils are equal,  round, and reactive to light.  Neck: Neck supple.  Cardiovascular: Normal rate, regular rhythm and normal heart sounds.   No murmur heard. Pulmonary/Chest: Effort normal and breath sounds normal. No respiratory distress. She has no wheezes. She exhibits tenderness.  Abdominal: Soft. Bowel sounds are normal. There is no tenderness. There is no rebound.  Musculoskeletal: She exhibits no edema.  Neurological: She is alert and oriented to person, place, and time.  Skin: Skin is warm and dry.  Psychiatric: She has a normal mood and affect.    ED Course  Procedures (including critical care time) Labs Review Labs Reviewed - No data to display  Imaging Review Dg Chest 2 View  08/22/2013   CLINICAL DATA:  Shortness breath and chest pain.  EXAM: CHEST  2 VIEW  COMPARISON:  Two-view chest 04/19/2013.  FINDINGS: The heart size is normal. The lungs are clear. The visualized soft tissues and bony thorax are unremarkable.  IMPRESSION: No active cardiopulmonary disease.   Electronically Signed   By: Gennette Pachris  Mattern M.D.   On: 08/22/2013 14:05     EKG Interpretation   Date/Time:  Friday August 22 2013 13:02:19 EDT Ventricular Rate:  72 PR Interval:  162 QRS Duration: 84 QT Interval:  370 QTC Calculation: 405 R Axis:   82 Text Interpretation:  Normal sinus rhythm Nonspecific ST and T wave  abnormality Abnormal ECG Confirmed by Blia Totman  MD, Keimora Swartout ([id number]) on  08/22/2013 2:14:32 PM      MDM   Final diagnoses:  Upper respiratory infection  Chest wall pain    Patient presents with upper respiratory symptoms and chest pain.  She's afebrile and nontoxic. No evidence of wheezing on exam. She has reproducible chest wall pain. Suspect viral URI and cough causing inflammation of the chest muscles. Patient was given IM Toradol. Chest x-ray shows no evidence of pneumonia. Given his smoking history we'll also give prednisone for bronchitis. She'll be placed on a steroid taper. Patient is PERC neg.   patient will be discharged with prednisone and HFA inhaler. Patient was given strict return precautions.  After history, exam, and medical workup I feel the patient has been appropriately medically screened and is safe for discharge home. Pertinent diagnoses were discussed with the patient. Patient was given return precautions.    Shon Batonourtney F Natavia Sublette, MD 08/22/13 [phone]

## 2013-08-22 NOTE — Discharge Instructions (Signed)
Chest Wall Pain Chest wall pain is pain in or around the bones and muscles of your chest. It may take up to 6 weeks to get better. It may take longer if you must stay physically active in your work and activities.  CAUSES  Chest wall pain may happen on its own. However, it may be caused by:  A viral illness like the flu.  Injury.  Coughing.  Exercise.  Arthritis.  Fibromyalgia.  Shingles. HOME CARE INSTRUCTIONS   Avoid overtiring physical activity. Try not to strain or perform activities that cause pain. This includes any activities using your chest or your abdominal and side muscles, especially if heavy weights are used.  Put ice on the sore area.  Put ice in a plastic bag.  Place a towel between your skin and the bag.  Leave the ice on for 15-20 minutes per hour while awake for the first 2 days.  Only take over-the-counter or prescription medicines for pain, discomfort, or fever as directed by your caregiver. SEEK IMMEDIATE MEDICAL CARE IF:   Your pain increases, or you are very uncomfortable.  You have a fever.  Your chest pain becomes worse.  You have new, unexplained symptoms.  You have nausea or vomiting.  You feel sweaty or lightheaded.  You have a cough with phlegm (sputum), or you cough up blood. MAKE SURE YOU:   Understand these instructions.  Will watch your condition.  Will get help right away if you are not doing well or get worse. Document Released: 01/30/2005 Document Revised: 04/24/2011 Document Reviewed: 09/26/2010 [facility] Patient Information 2015 [place], [place]. This information is not intended to replace advice given to you by your health care provider. Make sure you discuss any questions you have with your health care provider. Upper Respiratory Infection, Adult An upper respiratory infection (URI) is also sometimes known as the common cold. The upper respiratory tract includes the nose, sinuses, throat, trachea, and bronchi. Bronchi are  the airways leading to the lungs. Most people improve within 1 week, but symptoms can last up to 2 weeks. A residual cough may last even longer.  CAUSES Many different viruses can infect the tissues lining the upper respiratory tract. The tissues become irritated and inflamed and often become very moist. Mucus production is also common. A cold is contagious. You can easily spread the virus to others by oral contact. This includes kissing, sharing a glass, coughing, or sneezing. Touching your mouth or nose and then touching a surface, which is then touched by another person, can also spread the virus. SYMPTOMS  Symptoms typically develop 1 to 3 days after you come in contact with a cold virus. Symptoms vary from person to person. They may include:  Runny nose.  Sneezing.  Nasal congestion.  Sinus irritation.  Sore throat.  Loss of voice (laryngitis).  Cough.  Fatigue.  Muscle aches.  Loss of appetite.  Headache.  Low-grade fever. DIAGNOSIS  You might diagnose your own cold based on familiar symptoms, since most people get a cold 2 to 3 times a year. Your caregiver can confirm this based on your exam. Most importantly, your caregiver can check that your symptoms are not due to another disease such as strep throat, sinusitis, pneumonia, asthma, or epiglottitis. Blood tests, throat tests, and X-rays are not necessary to diagnose a common cold, but they may sometimes be helpful in excluding other more serious diseases. Your caregiver will decide if any further tests are required. RISKS AND COMPLICATIONS  You may  may be at risk for a more severe case of the common cold if you smoke cigarettes, have chronic heart disease (such as heart failure) or lung disease (such as asthma), or if you have a weakened immune system. The very young and very old are also at risk for more serious infections. Bacterial sinusitis, middle ear infections, and bacterial pneumonia can complicate the common cold. The  common cold can worsen asthma and chronic obstructive pulmonary disease (COPD). Sometimes, these complications can require emergency medical care and may be life-threatening. °PREVENTION  °The best way to protect against getting a cold is to practice good hygiene. Avoid oral or hand contact with people with cold symptoms. Wash your hands often if contact occurs. There is no clear evidence that vitamin C, vitamin E, echinacea, or exercise reduces the chance of developing a cold. However, it is always recommended to get plenty of rest and practice good nutrition. °TREATMENT  °Treatment is directed at relieving symptoms. There is no cure. Antibiotics are not effective, because the infection is caused by a virus, not by bacteria. Treatment may include: °· Increased fluid intake. Sports drinks offer valuable electrolytes, sugars, and fluids. °· Breathing heated mist or steam (vaporizer or shower). °· Eating chicken soup or other clear broths, and maintaining good nutrition. °· Getting plenty of rest. °· Using gargles or lozenges for comfort. °· Controlling fevers with ibuprofen or acetaminophen as directed by your caregiver. °· Increasing usage of your inhaler if you have asthma. °Zinc gel and zinc lozenges, taken in the first 24 hours of the common cold, can shorten the duration and lessen the severity of symptoms. Pain medicines may help with fever, muscle aches, and throat pain. A variety of non-prescription medicines are available to treat congestion and runny nose. Your caregiver can make recommendations and may suggest nasal or lung inhalers for other symptoms.  °HOME CARE INSTRUCTIONS  °· Only take over-the-counter or prescription medicines for pain, discomfort, or fever as directed by your caregiver. °· Use a warm mist humidifier or inhale steam from a shower to increase air moisture. This may keep secretions moist and make it easier to breathe. °· Drink enough water and fluids to keep your urine clear or pale  yellow. °· Rest as needed. °· Return to work when your temperature has returned to normal or as your caregiver advises. You may need to stay home longer to avoid infecting others. You can also use a face mask and careful hand washing to prevent spread of the virus. °SEEK MEDICAL CARE IF:  °· After the first few days, you feel you are getting worse rather than better. °· You need your caregiver's advice about medicines to control symptoms. °· You develop chills, worsening shortness of breath, or brown or red sputum. These may be signs of pneumonia. °· You develop yellow or brown nasal discharge or pain in the face, especially when you bend forward. These may be signs of sinusitis. °· You develop a fever, swollen neck glands, pain with swallowing, or white areas in the back of your throat. These may be signs of strep throat. °SEEK IMMEDIATE MEDICAL CARE IF:  °· You have a fever. °· You develop severe or persistent headache, ear pain, sinus pain, or chest pain. °· You develop wheezing, a prolonged cough, cough up blood, or have a change in your usual mucus (if you have chronic lung disease). °· You develop sore muscles or a stiff neck. °Document Released: 07/26/2000 Document Revised: 04/24/2011 Document Reviewed: 06/03/2010 °[employer]®   Information 2015 [place], [place]. This information is not intended to replace advice given to you by your health care provider. Make sure you discuss any questions you have with your health care provider.

## 2014-07-05 ENCOUNTER — Emergency Department (HOSPITAL_BASED_OUTPATIENT_CLINIC_OR_DEPARTMENT_OTHER)
Admission: EM | Admit: 2014-07-05 | Discharge: 2014-07-05 | Disposition: A | Discharge status: Home / Self Care | Payer: 59 | Attending: Emergency Medicine | Admitting: Emergency Medicine

## 2014-07-05 ENCOUNTER — Encounter (HOSPITAL_BASED_OUTPATIENT_CLINIC_OR_DEPARTMENT_OTHER): Payer: Self-pay | Admitting: *Deleted

## 2014-07-05 DIAGNOSIS — J069 Acute upper respiratory infection, unspecified: Secondary | ICD-10-CM | POA: Diagnosis not present

## 2014-07-05 DIAGNOSIS — Z3202 Encounter for pregnancy test, result negative: Secondary | ICD-10-CM | POA: Insufficient documentation

## 2014-07-05 DIAGNOSIS — Z79899 Other long term (current) drug therapy: Secondary | ICD-10-CM | POA: Diagnosis not present

## 2014-07-05 DIAGNOSIS — M545 Low back pain, unspecified: Secondary | ICD-10-CM

## 2014-07-05 DIAGNOSIS — Z720 Tobacco use: Secondary | ICD-10-CM | POA: Diagnosis not present

## 2014-07-05 DIAGNOSIS — Z8679 Personal history of other diseases of the circulatory system: Secondary | ICD-10-CM | POA: Diagnosis not present

## 2014-07-05 LAB — PREGNANCY, URINE: PREG TEST UR: NEGATIVE

## 2014-07-05 LAB — URINE MICROSCOPIC-ADD ON

## 2014-07-05 LAB — URINALYSIS, ROUTINE W REFLEX MICROSCOPIC
Bilirubin Urine: NEGATIVE
GLUCOSE, UA: NEGATIVE mg/dL
Ketones, ur: 15 mg/dL — AB
Leukocytes, UA: NEGATIVE
Nitrite: NEGATIVE
PH: 5.5 (ref 5.0–8.0)
Protein, ur: NEGATIVE mg/dL
Specific Gravity, Urine: 1.027 (ref 1.005–1.030)
Urobilinogen, UA: 1 mg/dL (ref 0.0–1.0)

## 2014-07-05 MED ORDER — IBUPROFEN 800 MG PO TABS
800.0000 mg | ORAL_TABLET | Freq: Once | ORAL | Status: AC
  Administered 2014-07-05: 800 mg via ORAL
  Filled 2014-07-05: qty 1

## 2014-07-05 NOTE — ED Notes (Signed)
Pt report 4 days of cold symptoms and low back pain- denies dysuria, denies injury

## 2014-07-05 NOTE — Discharge Instructions (Signed)
Use nasal saline (you can try [employer] Simply Saline) at least 4 times a day, use saline 5-10 minutes before using the fluticasone (flonase) nasal spray  Do not use Afrin (Oxymetazoline)  Rest, wash hands frequently  and drink plenty of water.  You may try counter medication such as Mucinex or Sudafed decongestant.  Please follow with your primary care doctor in the next 2 days for a check-up. They must obtain records for further management.   Do not hesitate to return to the Emergency Department for any new, worsening or concerning symptoms.   For pain control please take ibuprofen (also known as Motrin or Advil) 800mg  (this is normally 4 over the counter pills) 3 times a day  for 5 days. Take with food to minimize stomach irritation.   Back Pain, Adult Back pain is very common. The pain often gets better over time. The cause of back pain is usually not dangerous. Most people can learn to manage their back pain on their own.  HOME CARE   Stay active. Start with short walks on flat ground if you can. Try to walk farther each day.  Do not sit, drive, or stand in one place for more than 30 minutes. Do not stay in bed.  Do not avoid exercise or work. Activity can help your back heal faster.  Be careful when you bend or lift an object. Bend at your knees, keep the object close to you, and do not twist.  Sleep on a firm mattress. Lie on your side, and bend your knees. If you lie on your back, put a pillow under your knees.  Only take medicines as told by your doctor.  Put ice on the injured area.  Put ice in a plastic bag.  Place a towel between your skin and the bag.  Leave the ice on for 15-20 minutes, 03-04 times a day for the first 2 to 3 days. After that, you can switch between ice and heat packs.  Ask your doctor about back exercises or massage.  Avoid feeling anxious or stressed. Find good ways to deal with stress, such as exercise. GET HELP RIGHT AWAY IF:   Your  pain does not go away with rest or medicine.  Your pain does not go away in 1 week.  You have new problems.  You do not feel well.  The pain spreads into your legs.  You cannot control when you poop (bowel movement) or pee (urinate).  Your arms or legs feel weak or lose feeling (numbness).  You feel sick to your stomach (nauseous) or throw up (vomit).  You have belly (abdominal) pain.  You feel like you may pass out (faint). MAKE SURE YOU:   Understand these instructions.  Will watch your condition.  Will get help right away if you are not doing well or get worse. Document Released: 07/19/2007 Document Revised: 04/24/2011 Document Reviewed: 06/03/2013 [facility] Patient Information 2015 [place], [place]. This information is not intended to replace advice given to you by your health care provider. Make sure you discuss any questions you have with your health care provider.

## 2014-07-05 NOTE — ED Provider Notes (Signed)
CSN: [id number]     Arrival date & time 07/05/14  1629 History   First MD Initiated Contact with Patient 07/05/14 1650     Chief Complaint  Patient presents with  . URI  . Back Pain     (Consider location/radiation/quality/duration/timing/severity/associated sxs/prior Treatment) HPI  Tara Cherry is a 29 y.o. female complaining of rhinorrhea, nasal congestion, dry cough, sore throat onset 3 days ago. Patient denies fever, chills, nausea, vomiting, chest pain, shortness of breath,  h/o DVT/PE, calf pain or leg swelling, hemoptysis, recent immobilization, cancer/chemotherapy in the last 6 months, exogenous estrogen.  She reports a low back pain which she's had for several months, she rated at 6 out of 10, exacerbated by movement and palpation, is described as sore and aching.  She has had any pain medication prior to arrival. She is concerned that is her kidneys because she states that she drinks a lot of soda. She does not be pressed medical history of kidney stones. Patient denies dysuria, hematuria (she is menstruating), concentrated or foul-smelling urine however she reports urinary frequency.  Past Medical History  Diagnosis Date  . Migraines   . Migraines    Past Surgical History  Procedure Laterality Date  . Cesarean section    . Tubal ligation     No family history on file. History  Substance Use Topics  . Smoking status: Current Every Day Smoker -- 0.25 packs/day    Types: Cigarettes  . Smokeless tobacco: Never Used  . Alcohol Use: No   OB History    No data available     Review of Systems  10 systems reviewed and found to be negative, except as noted in the HPI.   Allergies  Levaquin  Home Medications   Prior to Admission medications   Medication Sig Start Date End Date Taking? Authorizing Provider  albuterol (PROVENTIL HFA;VENTOLIN HFA) 108 (90 BASE) MCG/ACT inhaler Inhale 1-2 puffs into the lungs every 6 (six) hours as needed for wheezing. 06/08/12  Yes  April Palumbo, MD  fexofenadine (ALLEGRA) 180 MG tablet Take 180 mg by mouth daily.   Yes Historical Provider, MD  albuterol (PROVENTIL HFA;VENTOLIN HFA) 108 (90 BASE) MCG/ACT inhaler Inhale 2 puffs into the lungs every 4 (four) hours as needed for wheezing or shortness of breath. 08/22/13   Shon Baton, MD  guaiFENesin-codeine (ROBITUSSIN AC) 100-10 MG/5ML syrup Take 5 mLs by mouth 3 (three) times daily as needed for cough. 04/20/13   Hope Orlene Och, NP  ibuprofen (ADVIL,MOTRIN) 600 MG tablet Take 1 tablet (600 mg total) by mouth every 6 (six) hours as needed. 03/07/13   Shon Baton, MD  ibuprofen (ADVIL,MOTRIN) 600 MG tablet Take 1 tablet (600 mg total) by mouth every 6 (six) hours as needed. 08/22/13   Shon Baton, MD  ibuprofen (ADVIL,MOTRIN) 800 MG tablet Take 1 tablet (800 mg total) by mouth 3 (three) times daily. 12/09/12   Jerelyn Scott, MD  Loratadine (CLARITIN PO) Take 1 tablet by mouth daily.    Historical Provider, MD  ondansetron (ZOFRAN ODT) 8 MG disintegrating tablet Take 1 tablet (8 mg total) by mouth every 8 (eight) hours as needed for nausea or vomiting. 04/20/13   Hope Orlene Och, NP  predniSONE (DELTASONE) 20 MG tablet Take 3 tablets (60 mg total) by mouth once. 08/22/13   Shon Baton, MD   BP 110/82 mmHg  Pulse 102  Temp(Src) 98.4 F (36.9 C) (Oral)  Resp 18  Ht  (1.499  m)  Wt 158 lb (71.668 kg)  BMI 31.89 kg/m2  SpO2 100%  LMP 07/04/2014 Physical Exam  Constitutional: She is oriented to person, place, and time. She appears well-developed and well-nourished. No distress.  HENT:  Head: Normocephalic and atraumatic.  Nose: Rhinorrhea present.  Mouth/Throat: Oropharynx is clear and moist.  No drooling or stridor. Posterior pharynx mildly erythematous no significant tonsillar hypertrophy. No exudate. Soft palate rises symmetrically. No TTP or induration under tongue.   No tenderness to palpation of frontal or bilateral maxillary sinuses.  No mucosal  edema in the nares.  Bilateral tympanic membranes with normal architecture and good light reflex.    Eyes: Conjunctivae and EOM are normal. Pupils are equal, round, and reactive to light.  Neck: Normal range of motion.  Cardiovascular: Normal rate, regular rhythm and intact distal pulses.   Pulmonary/Chest: Effort normal and breath sounds normal. No respiratory distress. She has no wheezes. She has no rales. She exhibits no tenderness.  Abdominal: Soft. There is no tenderness.  Genitourinary:  No CVAT  Musculoskeletal: Normal range of motion.       Back:  No calf asymmetry, superficial collaterals, palpable cords, edema, Homans sign negative bilaterally.   Neurological: She is alert and oriented to person, place, and time.  No point tenderness to percussion of lumbar spinal processes. ++TTP ++ lumbar paraspinal muscular spasm. Strength is 5 out of 5 to bilateral lower extremities at hip and knee; extensor hallucis longus 5 out of 5. Ankle strength 5 out of 5, no clonus, neurovascularly intact. No saddle anaesthesia. Patellar reflexes are 2+ bilaterally.     Skin: She is not diaphoretic.  Psychiatric: She has a normal mood and affect.  Nursing note and vitals reviewed.   ED Course  Procedures (including critical care time) Labs Review Labs Reviewed  URINALYSIS, ROUTINE W REFLEX MICROSCOPIC - Abnormal; Notable for the following:    Hgb urine dipstick SMALL (*)    Ketones, ur 15 (*)    All other components within normal limits  URINE MICROSCOPIC-ADD ON - Abnormal; Notable for the following:    Bacteria, UA MANY (*)    All other components within normal limits  PREGNANCY, URINE    Imaging Review No results found.   EKG Interpretation None      MDM   Final diagnoses:  URI, acute  Bilateral low back pain without sciatica   Filed Vitals:   07/05/14 1636  BP: 110/82  Pulse: 102  Temp: 98.4 F (36.9 C)  TempSrc: Oral  Resp: 18  Height: 4\' 11"  (1.499 m)  Weight:  158 lb (71.668 kg)  SpO2: 100%    Medications  ibuprofen (ADVIL,MOTRIN) tablet 800 mg (800 mg Oral Given 07/05/14 1719)    Tara Cherry is a pleasant 29 y.o. female presenting with URI symptoms, also reports low back pain and urinary frequency. Lung sounds are clear to auscultation, patient saturating well on room air, I doubt this is a pneumonia. Urinalysis shows no signs of infection, there is hemoglobin however patient is menstruating.recommend Sudafed and Motrin.  Evaluation does not show pathology that would require ongoing emergent intervention or inpatient treatment. Pt is hemodynamically stable and mentating appropriately. Discussed findings and plan with patient/guardian, who agrees with care plan. All questions answered. Return precautions discussed and outpatient follow up given.     Wynetta Emeryicole Sharifah Champine, PA-C 07/05/14 1903  Zadie Rhineonald Wickline, MD 07/05/14 [phone]

## 2014-07-05 NOTE — ED Notes (Signed)
Pt c/o cold symptoms, coughing, lower back pain, congestion. Occurring for approx 1 week

## 2014-08-30 ENCOUNTER — Emergency Department (HOSPITAL_BASED_OUTPATIENT_CLINIC_OR_DEPARTMENT_OTHER): Payer: Medicaid Other

## 2014-08-30 ENCOUNTER — Encounter (HOSPITAL_BASED_OUTPATIENT_CLINIC_OR_DEPARTMENT_OTHER): Payer: Self-pay | Admitting: *Deleted

## 2014-08-30 ENCOUNTER — Emergency Department (HOSPITAL_BASED_OUTPATIENT_CLINIC_OR_DEPARTMENT_OTHER)
Admission: EM | Admit: 2014-08-30 | Discharge: 2014-08-30 | Disposition: A | Discharge status: Home / Self Care | Payer: Medicaid Other | Attending: Emergency Medicine | Admitting: Emergency Medicine

## 2014-08-30 DIAGNOSIS — R51 Headache: Secondary | ICD-10-CM | POA: Insufficient documentation

## 2014-08-30 DIAGNOSIS — Z720 Tobacco use: Secondary | ICD-10-CM | POA: Insufficient documentation

## 2014-08-30 DIAGNOSIS — R0602 Shortness of breath: Secondary | ICD-10-CM | POA: Diagnosis not present

## 2014-08-30 DIAGNOSIS — M549 Dorsalgia, unspecified: Secondary | ICD-10-CM | POA: Insufficient documentation

## 2014-08-30 DIAGNOSIS — R040 Epistaxis: Secondary | ICD-10-CM | POA: Insufficient documentation

## 2014-08-30 DIAGNOSIS — Z8679 Personal history of other diseases of the circulatory system: Secondary | ICD-10-CM | POA: Diagnosis not present

## 2014-08-30 DIAGNOSIS — Z79899 Other long term (current) drug therapy: Secondary | ICD-10-CM | POA: Insufficient documentation

## 2014-08-30 DIAGNOSIS — Z791 Long term (current) use of non-steroidal anti-inflammatories (NSAID): Secondary | ICD-10-CM | POA: Diagnosis not present

## 2014-08-30 DIAGNOSIS — R519 Headache, unspecified: Secondary | ICD-10-CM

## 2014-08-30 DIAGNOSIS — R0981 Nasal congestion: Secondary | ICD-10-CM | POA: Insufficient documentation

## 2014-08-30 DIAGNOSIS — R079 Chest pain, unspecified: Secondary | ICD-10-CM | POA: Diagnosis not present

## 2014-08-30 MED ORDER — IBUPROFEN 400 MG PO TABS
400.0000 mg | ORAL_TABLET | Freq: Once | ORAL | Status: AC
  Administered 2014-08-30: 400 mg via ORAL
  Filled 2014-08-30: qty 1

## 2014-08-30 MED ORDER — ALBUTEROL SULFATE HFA 108 (90 BASE) MCG/ACT IN AERS
2.0000 | INHALATION_SPRAY | RESPIRATORY_TRACT | Status: DC | PRN
  Administered 2014-08-30: 2 via RESPIRATORY_TRACT
  Filled 2014-08-30: qty 6.7

## 2014-08-30 MED ORDER — AEROCHAMBER PLUS W/MASK MISC
1.0000 | Freq: Once | Status: AC
  Administered 2014-08-30: 1
  Filled 2014-08-30: qty 1

## 2014-08-30 NOTE — ED Notes (Signed)
Pt reports headache and facial pain x 1 week- - also reports chest pressure, back pain and nosebleeds

## 2014-08-30 NOTE — Discharge Instructions (Signed)
Continue Allegra and Sudafed as directed. Use saline nasal spray one squirt into each nostril every 2 hours while awake. Take Advil as directed for pain. Ask your primary care physician to help you to stop smoking, as smoking leads to these sorts of problems. See your primary care physician if not improving in 10-14 days. Return if concern for any reason.

## 2014-08-30 NOTE — ED Provider Notes (Addendum)
CSN: [id number]     Arrival date & time 08/30/14  1415 History   First MD Initiated Contact with Patient 08/30/14 1501     Chief Complaint  Patient presents with  . Headache  . Chest Pain     (Consider location/radiation/quality/duration/timing/severity/associated sxs/prior Treatment) HPI Patient complains of congested feeling at frontal area, bilateral maxillary area for the past one week. She is treated himself with Allegra and Sudafed with partial relief. She also had a nosebleed last night from her right nar which lasted 10 minutes, resolve spontaneously. She denies fever denies rhinorrhea. Patient also complains of diffuse chest pressure for one week constant, nonexertional, worse with lying down, accompanied by mild shortness of breath, gradual onset. No fever no cough. Also reports low back pain for one month, which is improving spontaneously with time. No urinary symptoms no abdominal pain nothing makes back pain better or worse. Past Medical History  Diagnosis Date  . Migraines   . Migraines    Past Surgical History  Procedure Laterality Date  . Cesarean section    . Tubal ligation     No family history on file. History  Substance Use Topics  . Smoking status: Current Every Day Smoker -- 0.25 packs/day    Types: Cigarettes  . Smokeless tobacco: Never Used  . Alcohol Use: No   OB History    No data available     Review of Systems  Constitutional: Negative.   HENT: Positive for congestion and nosebleeds.   Respiratory: Positive for shortness of breath.   Cardiovascular: Positive for chest pain.  Gastrointestinal: Negative.   Musculoskeletal: Positive for back pain.  Skin: Negative.   Neurological: Negative.   Psychiatric/Behavioral: Negative.   All other systems reviewed and are negative.     Allergies  Levaquin  Home Medications   Prior to Admission medications   Medication Sig Start Date End Date Taking? Authorizing Provider  fexofenadine (ALLEGRA)  180 MG tablet Take 180 mg by mouth daily.   Yes Historical Provider, MD  ibuprofen (ADVIL,MOTRIN) 800 MG tablet Take 1 tablet (800 mg total) by mouth 3 (three) times daily. 12/09/12  Yes Jerelyn Scott, MD  albuterol (PROVENTIL HFA;VENTOLIN HFA) 108 (90 BASE) MCG/ACT inhaler Inhale 1-2 puffs into the lungs every 6 (six) hours as needed for wheezing. 06/08/12   April Palumbo, MD  albuterol (PROVENTIL HFA;VENTOLIN HFA) 108 (90 BASE) MCG/ACT inhaler Inhale 2 puffs into the lungs every 4 (four) hours as needed for wheezing or shortness of breath. 08/22/13   Shon Baton, MD  guaiFENesin-codeine (ROBITUSSIN AC) 100-10 MG/5ML syrup Take 5 mLs by mouth 3 (three) times daily as needed for cough. 04/20/13   Hope Orlene Och, NP  ibuprofen (ADVIL,MOTRIN) 600 MG tablet Take 1 tablet (600 mg total) by mouth every 6 (six) hours as needed. 03/07/13   Shon Baton, MD  ibuprofen (ADVIL,MOTRIN) 600 MG tablet Take 1 tablet (600 mg total) by mouth every 6 (six) hours as needed. 08/22/13   Shon Baton, MD  Loratadine (CLARITIN PO) Take 1 tablet by mouth daily.    Historical Provider, MD  ondansetron (ZOFRAN ODT) 8 MG disintegrating tablet Take 1 tablet (8 mg total) by mouth every 8 (eight) hours as needed for nausea or vomiting. 04/20/13   Hope Orlene Och, NP  predniSONE (DELTASONE) 20 MG tablet Take 3 tablets (60 mg total) by mouth once. 08/22/13   Shon Baton, MD   medications Allegra, Sudafed, ibuprofen she is currently not taking any  of the other medications listed BP 126/85 mmHg  Pulse 108  Temp(Src) 98.4 F (36.9 C) (Oral)  Resp 18  Ht 4\' 11"  (1.499 m)  Wt 158 lb (71.668 kg)  BMI 31.89 kg/m2  SpO2 100%  LMP 08/09/2014 Physical Exam  Constitutional: She appears well-developed and well-nourished.  HENT:  Head: Normocephalic and atraumatic.  Eyes: Conjunctivae are normal. Pupils are equal, round, and reactive to light.  Neck: Neck supple. No tracheal deviation present. No thyromegaly present.   Cardiovascular: Normal rate and regular rhythm.   No murmur heard. Pulmonary/Chest: Effort normal and breath sounds normal. No respiratory distress.  Speaks in paragraphs  Abdominal: Soft. Bowel sounds are normal. She exhibits no distension. There is no tenderness.  Musculoskeletal: Normal range of motion. She exhibits no edema or tenderness.  Neurological: She is alert. Coordination normal.  Skin: Skin is warm and dry. No rash noted.  Psychiatric: She has a normal mood and affect.  Nursing note and vitals reviewed.   ED Course  Procedures (including critical care time) Labs Review Labs Reviewed - No data to display  Imaging Review No results found.   EKG Interpretation None     ED ECG REPORT   Date: 08/30/2014  Rate: 100  Rhythm: sinus tachycardia  QRS Axis: normal  Intervals: normal  ST/T Wave abnormalities: nonspecific T wave changes  Conduction Disutrbances:none  Narrative Interpretation:   Old EKG Reviewed: none available  I have personally reviewed the EKG tracing and agree with the computerized printout as noted. Chest x-ray viewed by me Results for orders placed or performed during the hospital encounter of 07/05/14  Urinalysis, Routine w reflex microscopic  Result Value Ref Range   Color, Urine YELLOW YELLOW   APPearance CLEAR CLEAR   Specific Gravity, Urine 1.027 1.005 - 1.030   pH 5.5 5.0 - 8.0   Glucose, UA NEGATIVE NEGATIVE mg/dL   Hgb urine dipstick SMALL (A) NEGATIVE   Bilirubin Urine NEGATIVE NEGATIVE   Ketones, ur 15 (A) NEGATIVE mg/dL   Protein, ur NEGATIVE NEGATIVE mg/dL   Urobilinogen, UA 1.0 0.0 - 1.0 mg/dL   Nitrite NEGATIVE NEGATIVE   Leukocytes, UA NEGATIVE NEGATIVE  Pregnancy, urine  Result Value Ref Range   Preg Test, Ur NEGATIVE NEGATIVE  Urine microscopic-add on  Result Value Ref Range   Squamous Epithelial / LPF RARE RARE   Bacteria, UA MANY (A) RARE   Urine-Other MUCOUS PRESENT    Dg Chest 2 View  08/30/2014   CLINICAL  DATA:  Chest pain, shortness of Breath  EXAM: CHEST  2 VIEW  COMPARISON:  08/22/2013.  FINDINGS: The heart size and mediastinal contours are within normal limits. Both lungs are clear. The visualized skeletal structures are unremarkable.  IMPRESSION: No active cardiopulmonary disease.   Electronically Signed   By: Natasha MeadLiviu  Pop M.D.   On: 08/30/2014 15:36    4:25 PM patient feels improved and breathing improved after treatment with albuterol HFA with spacer MDM  Congestion and face and headaches are typical of sinus headaches which she's had in the past. I'll feel that antibiotics is warranted no fever no rhinorrhea symptoms only lasting for one week. Chest pain felt not to be nonspecific no rubs no murmurs, highly atypical for acute coronary syndrome, no EKG evidence of pericarditis. Doubt pulmonary embolism. Gradual onset back pain felt to be nonspecific, improving spontaneously with time Final diagnoses:  None   Plan albuterol HFA to go to take 2 puffs every 4 hours when necessary shortness  of breath Saline nasal spray. She can continue Allegra , ibuprofen and Sudafed. I counseled patient for 5 minutes on smoking cessation Diagnosis #1 sinus headache #2 nosebleed #3 atypical chest pain #4 chronic back pain #5 tobacco abuse     Doug Sou, MD 08/30/14 1630  Doug Sou, MD 08/30/14 [phone]

## 2015-08-16 ENCOUNTER — Encounter (HOSPITAL_BASED_OUTPATIENT_CLINIC_OR_DEPARTMENT_OTHER): Payer: Self-pay | Admitting: *Deleted

## 2015-08-16 ENCOUNTER — Emergency Department (HOSPITAL_BASED_OUTPATIENT_CLINIC_OR_DEPARTMENT_OTHER): Payer: Self-pay

## 2015-08-16 ENCOUNTER — Emergency Department (HOSPITAL_BASED_OUTPATIENT_CLINIC_OR_DEPARTMENT_OTHER)
Admission: EM | Admit: 2015-08-16 | Discharge: 2015-08-16 | Disposition: A | Discharge status: Home / Self Care | Payer: Self-pay | Attending: Emergency Medicine | Admitting: Emergency Medicine

## 2015-08-16 DIAGNOSIS — R112 Nausea with vomiting, unspecified: Secondary | ICD-10-CM | POA: Insufficient documentation

## 2015-08-16 DIAGNOSIS — R102 Pelvic and perineal pain: Secondary | ICD-10-CM | POA: Insufficient documentation

## 2015-08-16 DIAGNOSIS — Z79899 Other long term (current) drug therapy: Secondary | ICD-10-CM | POA: Insufficient documentation

## 2015-08-16 DIAGNOSIS — F17210 Nicotine dependence, cigarettes, uncomplicated: Secondary | ICD-10-CM | POA: Insufficient documentation

## 2015-08-16 DIAGNOSIS — R350 Frequency of micturition: Secondary | ICD-10-CM | POA: Insufficient documentation

## 2015-08-16 LAB — CBC WITH DIFFERENTIAL/PLATELET
BASOS ABS: 0 10*3/uL (ref 0.0–0.1)
BASOS PCT: 1 %
EOS ABS: 0.1 10*3/uL (ref 0.0–0.7)
Eosinophils Relative: 2 %
HCT: 38.6 % (ref 36.0–46.0)
HEMOGLOBIN: 13.4 g/dL (ref 12.0–15.0)
Lymphocytes Relative: 60 %
Lymphs Abs: 2.6 10*3/uL (ref 0.7–4.0)
MCH: 32.2 pg (ref 26.0–34.0)
MCHC: 34.7 g/dL (ref 30.0–36.0)
MCV: 92.8 fL (ref 78.0–100.0)
Monocytes Absolute: 0.3 10*3/uL (ref 0.1–1.0)
Monocytes Relative: 7 %
NEUTROS PCT: 30 %
Neutro Abs: 1.3 10*3/uL — ABNORMAL LOW (ref 1.7–7.7)
Platelets: 186 10*3/uL (ref 150–400)
RBC: 4.16 MIL/uL (ref 3.87–5.11)
RDW: 11.4 % — ABNORMAL LOW (ref 11.5–15.5)
WBC: 4.3 10*3/uL (ref 4.0–10.5)

## 2015-08-16 LAB — COMPREHENSIVE METABOLIC PANEL
ALBUMIN: 4 g/dL (ref 3.5–5.0)
ALK PHOS: 45 U/L (ref 38–126)
ALT: 10 U/L — AB (ref 14–54)
ANION GAP: 8 (ref 5–15)
AST: 14 U/L — ABNORMAL LOW (ref 15–41)
BUN: 9 mg/dL (ref 6–20)
CALCIUM: 9.2 mg/dL (ref 8.9–10.3)
CO2: 24 mmol/L (ref 22–32)
CREATININE: 0.76 mg/dL (ref 0.44–1.00)
Chloride: 106 mmol/L (ref 101–111)
Glucose, Bld: 87 mg/dL (ref 65–99)
Potassium: 3.8 mmol/L (ref 3.5–5.1)
SODIUM: 138 mmol/L (ref 135–145)
Total Bilirubin: 0.4 mg/dL (ref 0.3–1.2)
Total Protein: 7.3 g/dL (ref 6.5–8.1)

## 2015-08-16 LAB — WET PREP, GENITAL
Clue Cells Wet Prep HPF POC: NONE SEEN
Sperm: NONE SEEN
Trich, Wet Prep: NONE SEEN

## 2015-08-16 LAB — URINALYSIS, ROUTINE W REFLEX MICROSCOPIC
Glucose, UA: NEGATIVE mg/dL
Hgb urine dipstick: NEGATIVE
KETONES UR: 15 mg/dL — AB
NITRITE: NEGATIVE
PROTEIN: NEGATIVE mg/dL
Specific Gravity, Urine: 1.031 — ABNORMAL HIGH (ref 1.005–1.030)
pH: 6 (ref 5.0–8.0)

## 2015-08-16 LAB — URINE MICROSCOPIC-ADD ON

## 2015-08-16 LAB — PREGNANCY, URINE: Preg Test, Ur: NEGATIVE

## 2015-08-16 MED ORDER — ONDANSETRON 4 MG PO TBDP: 4.0000 mg | ORAL_TABLET | Freq: Three times a day (TID) | ORAL | Status: DC | PRN

## 2015-08-16 MED ORDER — CEPHALEXIN 500 MG PO CAPS: 500.0000 mg | ORAL_CAPSULE | Freq: Four times a day (QID) | ORAL | Status: DC

## 2015-08-16 MED ORDER — IBUPROFEN 400 MG PO TABS
400.0000 mg | ORAL_TABLET | Freq: Once | ORAL | Status: AC | PRN
  Administered 2015-08-16: 400 mg via ORAL
  Filled 2015-08-16: qty 1

## 2015-08-16 MED ORDER — KETOROLAC TROMETHAMINE 30 MG/ML IJ SOLN: 30.0000 mg | Freq: Once | INTRAMUSCULAR | Status: DC

## 2015-08-16 MED ORDER — SODIUM CHLORIDE 0.9 % IV BOLUS (SEPSIS)
1000.0000 mL | Freq: Once | INTRAVENOUS | Status: AC
  Administered 2015-08-16: 1000 mL via INTRAVENOUS

## 2015-08-16 MED ORDER — DICYCLOMINE HCL 20 MG PO TABS: 20.0000 mg | ORAL_TABLET | Freq: Two times a day (BID) | ORAL | Status: DC

## 2015-08-16 MED ORDER — FLUCONAZOLE 50 MG PO TABS
150.0000 mg | ORAL_TABLET | Freq: Once | ORAL | Status: AC
  Administered 2015-08-16: 150 mg via ORAL
  Filled 2015-08-16: qty 1

## 2015-08-16 MED FILL — CEPHALEXIN 500 MG CAPSULE: 500 | 5 days supply | Qty: 20 | Fill #0

## 2015-08-16 MED FILL — DICYCLOMINE 20 MG TABLET: 20 | 10 days supply | Qty: 20 | Fill #0

## 2015-08-16 NOTE — ED Notes (Signed)
Abdominal pain for a week. Fatigue and nausea.

## 2015-08-16 NOTE — ED Provider Notes (Signed)
CSN: [id number]     Arrival date & time 08/16/15  1240 History   First MD Initiated Contact with Patient 08/16/15 1455     Chief Complaint  Patient presents with  . Abdominal Pain     (Consider location/radiation/quality/duration/timing/severity/associated sxs/prior Treatment) HPI   Tressia DanasYamech Crooke is a 30 y.o. female, with a history of migraines, presenting to the ED with lower abdominal cramping, urinary frequency, fatigue, back pain, and N/V for the last week. Pt adds she had some diarrhea last week, but that has since resolved. Has taken ibuprofen with some relief. Endorses 1 episode of vomiting in last 24 hours. Has never experienced this combination of symptoms before.  Some increased clear vaginal discharge. Rates her pain at 5/10. Last food intake was yesterday. Denies fever/chills, constipation, dysuria, or any other complaints. LMP June 13.     Past Medical History  Diagnosis Date  . Migraines   . Migraines    Past Surgical History  Procedure Laterality Date  . Cesarean section    . Tubal ligation     No family history on file. Social History  Substance Use Topics  . Smoking status: Current Every Day Smoker -- 0.25 packs/day    Types: Cigarettes  . Smokeless tobacco: Never Used  . Alcohol Use: No   OB History    No data available     Review of Systems  Constitutional: Negative for fever and chills.  Gastrointestinal: Positive for nausea, vomiting and abdominal pain.  Genitourinary: Positive for frequency, vaginal discharge and pelvic pain. Negative for dysuria and vaginal bleeding.  Neurological: Negative for dizziness and light-headedness.  All other systems reviewed and are negative.     Allergies  Levaquin  Home Medications   Prior to Admission medications   Medication Sig Start Date End Date Taking? Authorizing Provider  fexofenadine (ALLEGRA) 180 MG tablet Take 180 mg by mouth daily.   Yes Historical Provider, MD  ibuprofen (ADVIL,MOTRIN) 600 MG  tablet Take 1 tablet (600 mg total) by mouth every 6 (six) hours as needed. 03/07/13  Yes Shon Batonourtney F Horton, MD  albuterol (PROVENTIL HFA;VENTOLIN HFA) 108 (90 BASE) MCG/ACT inhaler Inhale 1-2 puffs into the lungs every 6 (six) hours as needed for wheezing. 06/08/12   April Palumbo, MD  albuterol (PROVENTIL HFA;VENTOLIN HFA) 108 (90 BASE) MCG/ACT inhaler Inhale 2 puffs into the lungs every 4 (four) hours as needed for wheezing or shortness of breath. 08/22/13   Shon Batonourtney F Horton, MD  guaiFENesin-codeine (ROBITUSSIN AC) 100-10 MG/5ML syrup Take 5 mLs by mouth 3 (three) times daily as needed for cough. 04/20/13   Hope Orlene OchM Neese, NP  ibuprofen (ADVIL,MOTRIN) 600 MG tablet Take 1 tablet (600 mg total) by mouth every 6 (six) hours as needed. 08/22/13   Shon Batonourtney F Horton, MD  ibuprofen (ADVIL,MOTRIN) 800 MG tablet Take 1 tablet (800 mg total) by mouth 3 (three) times daily. 12/09/12   Jerelyn ScottMartha Linker, MD  Loratadine (CLARITIN PO) Take 1 tablet by mouth daily.    Historical Provider, MD  ondansetron (ZOFRAN ODT) 8 MG disintegrating tablet Take 1 tablet (8 mg total) by mouth every 8 (eight) hours as needed for nausea or vomiting. 04/20/13   Hope Orlene OchM Neese, NP  predniSONE (DELTASONE) 20 MG tablet Take 3 tablets (60 mg total) by mouth once. 08/22/13   Shon Batonourtney F Horton, MD   BP 112/71 mmHg  Pulse 74  Temp(Src) 98.4 F (36.9 C) (Oral)  Resp 18  Ht 4\' 11"  (1.499 m)  Wt 70.308  kg  BMI 31.29 kg/m2  SpO2 100%  LMP 07/27/2015 Physical Exam  Constitutional: She appears well-developed and well-nourished. No distress.  HENT:  Head: Normocephalic and atraumatic.  Eyes: Conjunctivae are normal.  Neck: Neck supple.  Cardiovascular: Normal rate, regular rhythm, normal heart sounds and intact distal pulses.   Pulmonary/Chest: Effort normal and breath sounds normal. No respiratory distress.  Abdominal: Soft. Normal appearance and bowel sounds are normal. There is tenderness in the right lower quadrant and left lower quadrant.  There is no guarding, no CVA tenderness and negative Murphy's sign.  Genitourinary:  External genitalia normal Vagina with discharge - moderate white-yellow discharge noted in the vaginal vault. Cervix  normal negative for cervical motion tenderness Adnexa palpated, no masses or negative for tenderness noted Bladder palpated negative for tenderness Uterus palpated no masses or negative for tenderness Otherwise normal female genitalia. RN, Misty, served as Biomedical engineer during exam.  Musculoskeletal: She exhibits no edema or tenderness.  Lymphadenopathy:    She has no cervical adenopathy.       Right: No inguinal adenopathy present.       Left: No inguinal adenopathy present.  Neurological: She is alert.  Skin: Skin is warm and dry. She is not diaphoretic.  Psychiatric: She has a normal mood and affect. Her behavior is normal.  Nursing note and vitals reviewed.   ED Course  Pelvic exam Date/Time: 08/16/2015 3:35 PM Performed by: Anselm Pancoast Authorized by: Harolyn Rutherford C Consent: Verbal consent obtained. Risks and benefits: risks, benefits and alternatives were discussed Consent given by: patient Patient understanding: patient states understanding of the procedure being performed Patient consent: the patient's understanding of the procedure matches consent given Procedure consent: procedure consent matches procedure scheduled Patient identity confirmed: verbally with patient and arm band Local anesthesia used: no Patient sedated: no Patient tolerance: Patient tolerated the procedure well with no immediate complications   (including critical care time) Labs Review Labs Reviewed  WET PREP, GENITAL - Abnormal; Notable for the following:    Yeast Wet Prep HPF POC PRESENT (*)    WBC, Wet Prep HPF POC MANY (*)    All other components within normal limits  URINALYSIS, ROUTINE W REFLEX MICROSCOPIC (NOT AT [facility]) - Abnormal; Notable for the following:    APPearance CLOUDY (*)    Specific  Gravity, Urine 1.031 (*)    Bilirubin Urine SMALL (*)    Ketones, ur 15 (*)    Leukocytes, UA SMALL (*)    All other components within normal limits  URINE MICROSCOPIC-ADD ON - Abnormal; Notable for the following:    Squamous Epithelial / LPF 0-5 (*)    Bacteria, UA FEW (*)    All other components within normal limits  COMPREHENSIVE METABOLIC PANEL - Abnormal; Notable for the following:    AST 14 (*)    ALT 10 (*)    All other components within normal limits  CBC WITH DIFFERENTIAL/PLATELET - Abnormal; Notable for the following:    RDW 11.4 (*)    Neutro Abs 1.3 (*)    All other components within normal limits  PREGNANCY, URINE  HIV ANTIBODY (ROUTINE TESTING)  RPR  GC/CHLAMYDIA PROBE AMP ([place]) NOT AT [facility]    Imaging Review No results found. I have personally reviewed and evaluated these images and lab results as part of my medical decision-making.   EKG Interpretation None      MDM   Final diagnoses:  Pelvic pain in female    Hailei Besser  presents with lower abdominal cramping, nausea, vomiting, and fatigue for the last week.  No definite UTI on urinalysis, but patient's symptoms would suggest the presence of an UTI. Yeast and WBCs on wet prep. Ultrasound due to the location of the patient's pain. Patient is nontoxic appearing, afebrile, not tachycardic, not tachypneic, not hypotensive, and is in no apparent distress. Patient has no signs of sepsis or other serious or life-threatening condition. Patient's discomfort is well controlled with ibuprofen. Ultrasound shows no acute abnormality that needs to be addressed. Patient is pain-free on reassessment. Will treat the yeast infection and possible UTI. Patient to follow up with PCP as soon as possible should symptoms continue. Return precautions discussed. Patient voiced understanding of these instructions, agrees to the plan, and is comfortable with discharge.  Filed Vitals:   08/16/15 1253 08/16/15 1405 08/16/15  1500 08/16/15 1552  BP: 105/82 112/71 103/77 107/71  Pulse: 94 74 68   Temp: 98.4 F (36.9 C)     TempSrc: Oral     Resp: 20 18    Height: 4\' 11"  (1.499 m)     Weight: 70.308 kg     SpO2: 100% 100% 100%    Filed Vitals:   08/16/15 1405 08/16/15 1500 08/16/15 1552 08/16/15 1655  BP: 112/71 103/77 107/71 110/81  Pulse: 74 68  72  Temp:      TempSrc:      Resp: 18   18  Height:      Weight:      SpO2: 100% 100%  100%     Anselm PancoastShawn C Ingvald Theisen, PA-C 08/16/15 1739  Geoffery Lyonsouglas Delo, MD 08/17/15 [phone]

## 2015-08-16 NOTE — Discharge Instructions (Signed)
You have been seen today for abdominal cramping with nausea and vomiting. Your imaging and lab tests showed no significant abnormalities other than a possible UTI and a yeast infection. Follow up with PCP as needed should symptoms continue. Return to ED should symptoms worsen.

## 2015-08-16 NOTE — ED Notes (Signed)
Patient transported to Ultrasound 

## 2015-08-17 LAB — HIV ANTIBODY (ROUTINE TESTING W REFLEX): HIV Screen 4th Generation wRfx: NONREACTIVE

## 2015-08-17 LAB — RPR: RPR Ser Ql: NONREACTIVE

## 2015-08-18 LAB — GC/CHLAMYDIA PROBE AMP (~~LOC~~) NOT AT ARMC
CHLAMYDIA, DNA PROBE: NEGATIVE
NEISSERIA GONORRHEA: NEGATIVE

## 2015-09-13 ENCOUNTER — Encounter (HOSPITAL_BASED_OUTPATIENT_CLINIC_OR_DEPARTMENT_OTHER): Payer: Self-pay | Admitting: *Deleted

## 2015-09-13 ENCOUNTER — Emergency Department (HOSPITAL_BASED_OUTPATIENT_CLINIC_OR_DEPARTMENT_OTHER)
Admission: EM | Admit: 2015-09-13 | Discharge: 2015-09-13 | Disposition: A | Discharge status: Home / Self Care | Payer: 59 | Attending: Emergency Medicine | Admitting: Emergency Medicine

## 2015-09-13 DIAGNOSIS — Z791 Long term (current) use of non-steroidal anti-inflammatories (NSAID): Secondary | ICD-10-CM | POA: Insufficient documentation

## 2015-09-13 DIAGNOSIS — F17210 Nicotine dependence, cigarettes, uncomplicated: Secondary | ICD-10-CM | POA: Insufficient documentation

## 2015-09-13 DIAGNOSIS — N390 Urinary tract infection, site not specified: Secondary | ICD-10-CM | POA: Insufficient documentation

## 2015-09-13 LAB — URINE MICROSCOPIC-ADD ON

## 2015-09-13 LAB — URINALYSIS, ROUTINE W REFLEX MICROSCOPIC
Bilirubin Urine: NEGATIVE
GLUCOSE, UA: NEGATIVE mg/dL
Ketones, ur: NEGATIVE mg/dL
Nitrite: NEGATIVE
PROTEIN: NEGATIVE mg/dL
SPECIFIC GRAVITY, URINE: 1.014 (ref 1.005–1.030)
pH: 7 (ref 5.0–8.0)

## 2015-09-13 LAB — PREGNANCY, URINE: PREG TEST UR: NEGATIVE

## 2015-09-13 MED ORDER — CEPHALEXIN 250 MG PO CAPS: 500.0000 mg | ORAL_CAPSULE | Freq: Once | ORAL | Status: DC

## 2015-09-13 MED ORDER — CEPHALEXIN 500 MG PO CAPS: 500.0000 mg | ORAL_CAPSULE | Freq: Four times a day (QID) | ORAL | 0 refills | Status: DC

## 2015-09-13 MED FILL — CEPHALEXIN 500 MG CAPSULE: 500 | 7 days supply | Qty: 28 | Fill #0

## 2015-09-13 NOTE — ED Provider Notes (Signed)
[facility] Provider Note   CSN: [id number] Arrival date & time: 09/13/15  4098  First Provider Contact:  First MD Initiated Contact with Patient 09/13/15 [phone]        History   Chief Complaint Chief Complaint  Patient presents with  . Urinary Frequency    HPI Tara Cherry is a 30 y.o. female.  The history is provided by the patient.  Tara Cherry is a 30 y.o. female history of UTI, previous yeast infection here presenting with dysuria, urinary frequency for 3 days. States that she has history of UTI and this is similar to previous UTI. Has some burning when she urinates and just goes very frequently. Denies any flank pain or fevers or vomiting. Patient states that her tubes tied and denies being pregnant. She was here earlier this month and was diagnosed with possible yeast infection and UTI and was given diflucan, keflex.  Denise vaginal discharge or bleeding.    Past Medical History:  Diagnosis Date  . Migraines   . Migraines     There are no active problems to display for this patient.   Past Surgical History:  Procedure Laterality Date  . CESAREAN SECTION    . TUBAL LIGATION      OB History    No data available       Home Medications    Prior to Admission medications   Medication Sig Start Date End Date Taking? Authorizing Provider  fexofenadine (ALLEGRA) 180 MG tablet Take 180 mg by mouth daily.   Yes Historical Provider, MD  ibuprofen (ADVIL,MOTRIN) 600 MG tablet Take 1 tablet (600 mg total) by mouth every 6 (six) hours as needed. 03/07/13  Yes Shon Baton, MD    Family History No family history on file.  Social History Social History  Substance Use Topics  . Smoking status: Current Every Day Smoker    Packs/day: 0.25    Types: Cigarettes  . Smokeless tobacco: Never Used  . Alcohol use No     Allergies   Levaquin [levofloxacin in d5w]   Review of Systems Review of Systems  Genitourinary: Positive for dysuria and  frequency.  All other systems reviewed and are negative.    Physical Exam Updated Vital Signs BP 128/81 (BP Location: Left Arm)   Pulse 83   Temp 98.5 F (36.9 C) (Oral)   Resp 16   Ht  (1.499 m)   Wt 158 lb (71.7 kg)   LMP 08/28/2015   SpO2 100%   BMI 31.91 kg/m   Physical Exam  Constitutional: She is oriented to person, place, and time. She appears well-developed.  HENT:  Head: Normocephalic.  Eyes: EOM are normal. Pupils are equal, round, and reactive to light.  Neck: Normal range of motion. Neck supple.  Cardiovascular: Normal rate and regular rhythm.   Pulmonary/Chest: Effort normal and breath sounds normal.  Abdominal: Soft. Bowel sounds are normal.  Mild suprapubic tenderness, no CVAT   Musculoskeletal: Normal range of motion.  Neurological: She is oriented to person, place, and time.  Skin: Skin is warm.  Psychiatric: She has a normal mood and affect.  Nursing note and vitals reviewed.    ED Treatments / Results  Labs (all labs ordered are listed, but only abnormal results are displayed) Labs Reviewed  URINALYSIS, ROUTINE W REFLEX MICROSCOPIC (NOT AT [facility]) - Abnormal; Notable for the following:       Result Value   Hgb urine dipstick SMALL (*)    Leukocytes,  UA SMALL (*)    All other components within normal limits  URINE MICROSCOPIC-ADD ON - Abnormal; Notable for the following:    Squamous Epithelial / LPF 0-5 (*)    Bacteria, UA FEW (*)    All other components within normal limits  URINE CULTURE  PREGNANCY, URINE    EKG  EKG Interpretation None       Radiology No results found.  Procedures Procedures (including critical care time)  Medications Ordered in ED Medications  cephALEXin (KEFLEX) capsule 500 mg (not administered)     Initial Impression / Assessment and Plan / ED Course  I have reviewed the triage vital signs and the nursing notes.  Pertinent labs & imaging results that were available during my care of the patient  were reviewed by me and considered in my medical decision making (see chart for details).  Clinical Course    Tara Cherry is a 30 y.o. female here with dysuria, frequency, suprapubic pain. Likely uncomplicated UTI. Has no vaginal discharge. Will get UA, pregnancy. No CVAT to suggest stone or pyelo, afebrile well appearing.   9:49 AM UA + UTI. Will give keflex for a week. Will send off urine culture given recurrent UTI.   Final Clinical Impressions(s) / ED Diagnoses   Final diagnoses:  None    New Prescriptions New Prescriptions   No medications on file     Charlynne Pander, MD 09/13/15 [phone]

## 2015-09-13 NOTE — ED Triage Notes (Signed)
C/o urgency and burning with urination x 3 days. No vaginal discharge.

## 2015-09-13 NOTE — Discharge Instructions (Signed)
Take keflex four times daily for a week for UTI.  See your doctor  Return to ER if you have worse trouble urinating, urinating frequently, fever, abdominal pain, vomiting.

## 2015-09-15 LAB — URINE CULTURE: Culture: >=100000 — AB

## 2015-09-16 ENCOUNTER — Telehealth (HOSPITAL_BASED_OUTPATIENT_CLINIC_OR_DEPARTMENT_OTHER): Payer: Self-pay | Admitting: *Deleted

## 2015-09-16 NOTE — Telephone Encounter (Signed)
Post ED Visit - Positive Culture Follow-up  Culture report reviewed by antimicrobial stewardship pharmacist:  []  Enzo Bi, Pharm.D. []  Celedonio Miyamoto, Pharm.D., BCPS []  Garvin Fila, Pharm.D. []  Georgina Pillion, Pharm.D., BCPS []  [place], [place].D., BCPS, AAHIVP []  Estella Husk, Pharm.D., BCPS, AAHIVP []  Tennis Must, Pharm.D. []  Sherle Poe, [address].D.  Positive Escherichia  Coli culture Treated with Cephalexin 500mg . 4x daily as dispense, organism sensitive to the same and no further patient follow-up is required at this time. OK by Christena Flake PharmD  Kadince Boxley, [place] Boos 09/16/2015, 11:54 AM

## 2015-11-16 ENCOUNTER — Emergency Department (HOSPITAL_BASED_OUTPATIENT_CLINIC_OR_DEPARTMENT_OTHER): Payer: Self-pay

## 2015-11-16 ENCOUNTER — Encounter (HOSPITAL_BASED_OUTPATIENT_CLINIC_OR_DEPARTMENT_OTHER): Payer: Self-pay | Admitting: *Deleted

## 2015-11-16 ENCOUNTER — Emergency Department (HOSPITAL_BASED_OUTPATIENT_CLINIC_OR_DEPARTMENT_OTHER)
Admission: EM | Admit: 2015-11-16 | Discharge: 2015-11-16 | Disposition: A | Discharge status: Home / Self Care | Payer: Self-pay | Attending: Emergency Medicine | Admitting: Emergency Medicine

## 2015-11-16 DIAGNOSIS — B9689 Other specified bacterial agents as the cause of diseases classified elsewhere: Secondary | ICD-10-CM

## 2015-11-16 DIAGNOSIS — M545 Low back pain, unspecified: Secondary | ICD-10-CM

## 2015-11-16 DIAGNOSIS — R1031 Right lower quadrant pain: Secondary | ICD-10-CM

## 2015-11-16 DIAGNOSIS — N760 Acute vaginitis: Secondary | ICD-10-CM | POA: Insufficient documentation

## 2015-11-16 DIAGNOSIS — F17210 Nicotine dependence, cigarettes, uncomplicated: Secondary | ICD-10-CM | POA: Insufficient documentation

## 2015-11-16 DIAGNOSIS — Z79899 Other long term (current) drug therapy: Secondary | ICD-10-CM | POA: Insufficient documentation

## 2015-11-16 DIAGNOSIS — R102 Pelvic and perineal pain: Secondary | ICD-10-CM | POA: Insufficient documentation

## 2015-11-16 DIAGNOSIS — R1032 Left lower quadrant pain: Secondary | ICD-10-CM | POA: Insufficient documentation

## 2015-11-16 LAB — CBC WITH DIFFERENTIAL/PLATELET
BASOS ABS: 0 10*3/uL (ref 0.0–0.1)
BASOS PCT: 0 %
EOS PCT: 2 %
Eosinophils Absolute: 0.1 10*3/uL (ref 0.0–0.7)
HCT: 38.9 % (ref 36.0–46.0)
Hemoglobin: 13.3 g/dL (ref 12.0–15.0)
LYMPHS ABS: 1.6 10*3/uL (ref 0.7–4.0)
Lymphocytes Relative: 51 %
MCH: 31.8 pg (ref 26.0–34.0)
MCHC: 34.2 g/dL (ref 30.0–36.0)
MCV: 93.1 fL (ref 78.0–100.0)
Monocytes Absolute: 0.2 10*3/uL (ref 0.1–1.0)
Monocytes Relative: 7 %
NEUTROS ABS: 1.3 10*3/uL — AB (ref 1.7–7.7)
NEUTROS PCT: 40 %
PLATELETS: 192 10*3/uL (ref 150–400)
RBC: 4.18 MIL/uL (ref 3.87–5.11)
RDW: 11.6 % (ref 11.5–15.5)
WBC: 3.2 10*3/uL — AB (ref 4.0–10.5)

## 2015-11-16 LAB — URINALYSIS, ROUTINE W REFLEX MICROSCOPIC
Bilirubin Urine: NEGATIVE
Glucose, UA: NEGATIVE mg/dL
Hgb urine dipstick: NEGATIVE
KETONES UR: NEGATIVE mg/dL
LEUKOCYTES UA: NEGATIVE
NITRITE: NEGATIVE
PH: 6 (ref 5.0–8.0)
Protein, ur: NEGATIVE mg/dL
SPECIFIC GRAVITY, URINE: 1.028 (ref 1.005–1.030)

## 2015-11-16 LAB — LIPASE, BLOOD: LIPASE: 22 U/L (ref 11–51)

## 2015-11-16 LAB — COMPREHENSIVE METABOLIC PANEL
ALT: 13 U/L — ABNORMAL LOW (ref 14–54)
AST: 18 U/L (ref 15–41)
Albumin: 3.9 g/dL (ref 3.5–5.0)
Alkaline Phosphatase: 43 U/L (ref 38–126)
Anion gap: 5 (ref 5–15)
BUN: 9 mg/dL (ref 6–20)
CHLORIDE: 109 mmol/L (ref 101–111)
CO2: 25 mmol/L (ref 22–32)
Calcium: 9.3 mg/dL (ref 8.9–10.3)
Creatinine, Ser: 0.64 mg/dL (ref 0.44–1.00)
Glucose, Bld: 93 mg/dL (ref 65–99)
POTASSIUM: 3.8 mmol/L (ref 3.5–5.1)
Sodium: 139 mmol/L (ref 135–145)
Total Bilirubin: 0.5 mg/dL (ref 0.3–1.2)
Total Protein: 7.3 g/dL (ref 6.5–8.1)

## 2015-11-16 LAB — WET PREP, GENITAL
Sperm: NONE SEEN
TRICH WET PREP: NONE SEEN
YEAST WET PREP: NONE SEEN

## 2015-11-16 LAB — PREGNANCY, URINE: Preg Test, Ur: NEGATIVE

## 2015-11-16 MED ORDER — LIDOCAINE HCL (PF) 1 % IJ SOLN
INTRAMUSCULAR | Status: AC
  Administered 2015-11-16: 2.1 mL
  Filled 2015-11-16: qty 5

## 2015-11-16 MED ORDER — DEXTROSE 5 % IV SOLN: 1.0000 g | Freq: Once | INTRAVENOUS | Status: DC

## 2015-11-16 MED ORDER — SODIUM CHLORIDE 0.9 % IV BOLUS (SEPSIS)
1000.0000 mL | Freq: Once | INTRAVENOUS | Status: AC
  Administered 2015-11-16: 1000 mL via INTRAVENOUS

## 2015-11-16 MED ORDER — ONDANSETRON 4 MG PO TBDP: 4.0000 mg | ORAL_TABLET | Freq: Three times a day (TID) | ORAL | 0 refills | Status: AC | PRN

## 2015-11-16 MED ORDER — IOPAMIDOL (ISOVUE-300) INJECTION 61%
100.0000 mL | Freq: Once | INTRAVENOUS | Status: AC | PRN
  Administered 2015-11-16: 100 mL via INTRAVENOUS

## 2015-11-16 MED ORDER — METRONIDAZOLE 500 MG PO TABS: 500.0000 mg | ORAL_TABLET | Freq: Two times a day (BID) | ORAL | 0 refills | Status: AC

## 2015-11-16 MED ORDER — KETOROLAC TROMETHAMINE 15 MG/ML IJ SOLN
15.0000 mg | Freq: Once | INTRAMUSCULAR | Status: AC
  Administered 2015-11-16: 15 mg via INTRAVENOUS
  Filled 2015-11-16: qty 1

## 2015-11-16 MED ORDER — MORPHINE SULFATE (PF) 4 MG/ML IV SOLN: 4.0000 mg | Freq: Once | INTRAVENOUS | Status: DC

## 2015-11-16 MED ORDER — CEFTRIAXONE SODIUM 250 MG IJ SOLR
250.0000 mg | Freq: Once | INTRAMUSCULAR | Status: AC
  Administered 2015-11-16: 250 mg via INTRAMUSCULAR
  Filled 2015-11-16: qty 250

## 2015-11-16 MED ORDER — NAPROXEN 375 MG PO TABS: 375.0000 mg | ORAL_TABLET | Freq: Two times a day (BID) | ORAL | 0 refills | Status: AC | PRN

## 2015-11-16 MED ORDER — DOXYCYCLINE HYCLATE 100 MG PO CAPS: 100.0000 mg | ORAL_CAPSULE | Freq: Two times a day (BID) | ORAL | 0 refills | Status: AC

## 2015-11-16 NOTE — ED Provider Notes (Signed)
[facility] Provider Note   CSN: [id number] Arrival date & time: 11/16/15  4098     History   Chief Complaint Chief Complaint  Patient presents with  . Back Pain    HPI Tara Cherry is a 30 y.o. female.  HPI 30 year old female with past medical history of recurrent UTIs who presents with right lower quadrant and back pain. Patient states her symptoms started 1 week ago as gradual onset of aching, gnawing bilateral lower back pain. The pain has been constant since onset. She has been taking over-the-counter medications and has been able to work. Over the last 3 days She began have progressively worsening aching, gnawing right lower quadrant pain. She describes the pain is primarily aching but intermittently pinching sensation. The pain has progressively worsened as well and is now unbearable. She presented to work today but was unable to work due to this pain. She's had associated nausea but no vomiting. No anorexia and she feels like her appetite has actually increased. Denies any vaginal bleeding. She has chronic vaginal discharge that has not acutely changed.  Past Medical History:  Diagnosis Date  . Migraines   . Migraines     There are no active problems to display for this patient.   Past Surgical History:  Procedure Laterality Date  . CESAREAN SECTION    . TUBAL LIGATION      OB History    No data available       Home Medications    Prior to Admission medications   Medication Sig Start Date End Date Taking? Authorizing Provider  doxycycline (VIBRAMYCIN) 100 MG capsule Take 1 capsule (100 mg total) by mouth 2 (two) times daily. 11/16/15 11/30/15  Shaune Pollack, MD  fexofenadine (ALLEGRA) 180 MG tablet Take 180 mg by mouth daily.    Historical Provider, MD  ibuprofen (ADVIL,MOTRIN) 600 MG tablet Take 1 tablet (600 mg total) by mouth every 6 (six) hours as needed. 03/07/13   Shon Baton, MD  metroNIDAZOLE (FLAGYL) 500 MG tablet Take 1 tablet (500  mg total) by mouth 2 (two) times daily. 11/16/15 11/23/15  Shaune Pollack, MD  naproxen (NAPROSYN) 375 MG tablet Take 1 tablet (375 mg total) by mouth 2 (two) times daily as needed for moderate pain. 11/16/15 11/23/15  Shaune Pollack, MD  ondansetron (ZOFRAN ODT) 4 MG disintegrating tablet Take 1 tablet (4 mg total) by mouth every 8 (eight) hours as needed for nausea or vomiting. 11/16/15   Shaune Pollack, MD    Family History History reviewed. No pertinent family history.  Social History Social History  Substance Use Topics  . Smoking status: Current Every Day Smoker    Packs/day: 0.25    Types: Cigarettes  . Smokeless tobacco: Never Used  . Alcohol use No     Allergies   Levaquin [levofloxacin in d5w]   Review of Systems Review of Systems  Constitutional: Negative for chills and fever.  HENT: Negative for congestion, rhinorrhea and sore throat.   Eyes: Negative for visual disturbance.  Respiratory: Negative for cough, shortness of breath and wheezing.   Cardiovascular: Negative for chest pain and leg swelling.  Gastrointestinal: Positive for abdominal pain and nausea. Negative for diarrhea and vomiting.  Genitourinary: Positive for frequency, pelvic pain and vaginal discharge. Negative for dysuria, flank pain and vaginal bleeding.  Musculoskeletal: Negative for neck pain.  Skin: Negative for rash.  Allergic/Immunologic: Negative for immunocompromised state.  Neurological: Negative for syncope and headaches.  Hematological: Does not bruise/bleed easily.  All other systems reviewed and are negative.    Physical Exam Updated Vital Signs BP 107/71 (BP Location: Left Arm)   Pulse 75   Temp 99.3 F (37.4 C) (Oral)   Resp 18   Ht 4\' 11"  (1.499 m)   Wt 155 lb (70.3 kg)   LMP 11/06/2015   SpO2 100%   BMI 31.31 kg/m   Physical Exam  Constitutional: She is oriented to person, place, and time. She appears well-developed and well-nourished. No distress.  HENT:  Head:  Normocephalic and atraumatic.  Eyes: Conjunctivae are normal.  Neck: Neck supple.  Cardiovascular: Normal rate, regular rhythm and normal heart sounds.  Exam reveals no friction rub.   No murmur heard. Pulmonary/Chest: Effort normal and breath sounds normal. No respiratory distress. She has no wheezes. She has no rales.  Abdominal: Soft. Bowel sounds are normal. She exhibits no distension. There is tenderness (Moderate, right lower quadrant. No right upper quadrant tenderness. Negative Murphy's.).  Genitourinary:  Genitourinary Comments: Moderate amount of white vaginal discharge. Cervix is normal in appearance but has mild CMT. No adnexal fullness or tenderness bilaterally.  Musculoskeletal: She exhibits no edema.  Neurological: She is alert and oriented to person, place, and time. She exhibits normal muscle tone.  Skin: Skin is warm. Capillary refill takes less than 2 seconds.  Psychiatric: She has a normal mood and affect.  Nursing note and vitals reviewed.    ED Treatments / Results  Labs (all labs ordered are listed, but only abnormal results are displayed) Labs Reviewed  WET PREP, GENITAL - Abnormal; Notable for the following:       Result Value   Clue Cells Wet Prep HPF POC PRESENT (*)    WBC, Wet Prep HPF POC MANY (*)    All other components within normal limits  URINALYSIS, ROUTINE W REFLEX MICROSCOPIC (NOT AT [facility]) - Abnormal; Notable for the following:    APPearance CLOUDY (*)    All other components within normal limits  CBC WITH DIFFERENTIAL/PLATELET - Abnormal; Notable for the following:    WBC 3.2 (*)    Neutro Abs 1.3 (*)    All other components within normal limits  COMPREHENSIVE METABOLIC PANEL - Abnormal; Notable for the following:    ALT 13 (*)    All other components within normal limits  PREGNANCY, URINE  LIPASE, BLOOD  GC/CHLAMYDIA PROBE AMP ([place]) NOT AT [facility]    EKG  EKG Interpretation None       Radiology US Transvaginal  Non-ob  Result Date: 11/16/2015 CLINICAL DATA:  Low back pain radiating to the right lower quadrant EXAM: TRANSABDOMINAL AND TRANSVAGINAL ULTRASOUND OF PELVIS DOPPLER ULTRASOUND OF OVARIES TECHNIQUE: Both transabdominal and transvaginal ultrasound examinations of the pelvis were performed. Transabdominal technique was performed for global imaging of the pelvis including uterus, ovaries, adnexal regions, and pelvic cul-de-sac. It was necessary to proceed with endovaginal exam following the transabdominal exam to visualize the endometrium and ovaries. Color and duplex Doppler ultrasound was utilized to evaluate blood flow to the ovaries. COMPARISON:  None. FINDINGS: Uterus Measurements: 9.1 x 4.5 x 5.3 cm. No fibroids or other mass visualized. Endometrium Thickness: 7 mm.  No focal abnormality visualized. Right ovary Measurements: 3.5 x 2.4 x 2.5 cm. Normal appearance/no adnexal mass. Multiple ovarian follicles. Left ovary Measurements: 3 x 2.2 x 1.9 cm. Normal appearance/no adnexal mass. Multiple overhead follicles. Pulsed Doppler evaluation of both ovaries demonstrates normal low-resistance arterial and venous waveforms. Other findings No abnormal free fluid. IMPRESSION:  1. Normal pelvic ultrasound. 2. No ovarian torsion. Electronically Signed   By: Elige KoHetal  Patel   On: 11/16/2015 11:24   [place] Pelvis Complete  Result Date: 11/16/2015 CLINICAL DATA:  Low back pain radiating to the right lower quadrant EXAM: TRANSABDOMINAL AND TRANSVAGINAL ULTRASOUND OF PELVIS DOPPLER ULTRASOUND OF OVARIES TECHNIQUE: Both transabdominal and transvaginal ultrasound examinations of the pelvis were performed. Transabdominal technique was performed for global imaging of the pelvis including uterus, ovaries, adnexal regions, and pelvic cul-de-sac. It was necessary to proceed with endovaginal exam following the transabdominal exam to visualize the endometrium and ovaries. Color and duplex Doppler ultrasound was utilized to evaluate blood  flow to the ovaries. COMPARISON:  None. FINDINGS: Uterus Measurements: 9.1 x 4.5 x 5.3 cm. No fibroids or other mass visualized. Endometrium Thickness: 7 mm.  No focal abnormality visualized. Right ovary Measurements: 3.5 x 2.4 x 2.5 cm. Normal appearance/no adnexal mass. Multiple ovarian follicles. Left ovary Measurements: 3 x 2.2 x 1.9 cm. Normal appearance/no adnexal mass. Multiple overhead follicles. Pulsed Doppler evaluation of both ovaries demonstrates normal low-resistance arterial and venous waveforms. Other findings No abnormal free fluid. IMPRESSION: 1. Normal pelvic ultrasound. 2. No ovarian torsion. Electronically Signed   By: Elige KoHetal  Patel   On: 11/16/2015 11:24   Ct Abdomen Pelvis W Contrast  Result Date: 11/16/2015 CLINICAL DATA:  Right lower quadrant and bilateral flank pain for 1 week EXAM: CT ABDOMEN AND PELVIS WITH CONTRAST TECHNIQUE: Multidetector CT imaging of the abdomen and pelvis was performed using the standard protocol following bolus administration of intravenous contrast. CONTRAST:  100mL ISOVUE-300 IOPAMIDOL (ISOVUE-300) INJECTION 61% COMPARISON:  Abdomen film of 10/04/2011 FINDINGS: Lower chest: The lung bases are clear. Hepatobiliary: The liver enhances with no focal abnormality and no ductal dilatation is seen. The gallbladder is visualized and no calcified gallstones are seen. Pancreas: The pancreas is normal in size and the pancreatic duct is not dilated. Spleen: The spleen is unremarkable. Adrenals/Urinary Tract: The adrenal glands appear normal. The kidneys enhance with no calculus or mass and there is no evidence of hydronephrosis. The ureters are normal in caliber. The urinary bladder is not well distended but no abnormality is seen. Stomach/Bowel: The stomach is moderately fluid distended with no abnormality noted. No small bowel distention is seen. There is feces throughout the colon but no colonic abnormality is noted. The terminal ileum and the appendix are well seen and  both appear normal. No inflammatory process within the right lower quadrant is noted. Vascular/Lymphatic: The abdominal aorta is normal in caliber. No adenopathy is seen. Reproductive: The uterus is normal in size. There do appear to be bilateral ovarian follicles right slightly larger than left. No free fluid is seen within the pelvis. Other: None Musculoskeletal: The lumbar vertebrae are in normal alignment with normal intervertebral disc spaces. IMPRESSION: 1. No definite explanation for the patient's right lower quadrant pain is seen other than the presence of ovarian follicles. No free fluid is seen. 2. The appendix and terminal ileum are well visualized with no inflammatory process noted. Electronically Signed   By: Dwyane DeePaul  Barry M.D.   On: 11/16/2015 10:27   [place] Art/ven Flow Abd Pelv Doppler  Result Date: 11/16/2015 CLINICAL DATA:  Low back pain radiating to the right lower quadrant EXAM: TRANSABDOMINAL AND TRANSVAGINAL ULTRASOUND OF PELVIS DOPPLER ULTRASOUND OF OVARIES TECHNIQUE: Both transabdominal and transvaginal ultrasound examinations of the pelvis were performed. Transabdominal technique was performed for global imaging of the pelvis including uterus, ovaries, adnexal regions, and pelvic cul-de-sac.  It was necessary to proceed with endovaginal exam following the transabdominal exam to visualize the endometrium and ovaries. Color and duplex Doppler ultrasound was utilized to evaluate blood flow to the ovaries. COMPARISON:  None. FINDINGS: Uterus Measurements: 9.1 x 4.5 x 5.3 cm. No fibroids or other mass visualized. Endometrium Thickness: 7 mm.  No focal abnormality visualized. Right ovary Measurements: 3.5 x 2.4 x 2.5 cm. Normal appearance/no adnexal mass. Multiple ovarian follicles. Left ovary Measurements: 3 x 2.2 x 1.9 cm. Normal appearance/no adnexal mass. Multiple overhead follicles. Pulsed Doppler evaluation of both ovaries demonstrates normal low-resistance arterial and venous waveforms. Other  findings No abnormal free fluid. IMPRESSION: 1. Normal pelvic ultrasound. 2. No ovarian torsion. Electronically Signed   By: Elige Ko   On: 11/16/2015 11:24    Procedures Procedures (including critical care time)  Medications Ordered in ED Medications  sodium chloride 0.9 % bolus 1,000 mL (0 mLs Intravenous Stopped 11/16/15 1030)  iopamidol (ISOVUE-300) 61 % injection 100 mL (100 mLs Intravenous Contrast Given 11/16/15 1007)  ketorolac (TORADOL) 15 MG/ML injection 15 mg (15 mg Intravenous Given 11/16/15 1041)  cefTRIAXone (ROCEPHIN) injection 250 mg (250 mg Intramuscular Given 11/16/15 1200)  lidocaine (PF) (XYLOCAINE) 1 % injection (2.1 mLs  Given 11/16/15 1200)     Initial Impression / Assessment and Plan / ED Course  I have reviewed the triage vital signs and the nursing notes.  Pertinent labs & imaging results that were available during my care of the patient were reviewed by me and considered in my medical decision making (see chart for details).  Clinical Course   30 yo F with no significant PMHx here with RLQ pain, nausea. No fevers. No anorexia. On arrival, VS show mild tachycardia but are o/w WNL. Exam is as above. CBC shows mild leukopenia that is at baseline. CMP unremarkable. UA without signs of UTI. Wet prep with +WBCs, clue cells c/w BV. DDx includes symptomatic BV, PID, ovarian torsion, also cannot rule out appendicitis given RLQ TTP on exam (more so than adnexal). Discussed imaging with pt. Will start with CT for eval appendicitis, R flank pain.  CT unremarkable. Pelvic U/S subsequently obtained to eval for ovarian cyst, torsion, abscess   Pelvic U/S unremarkable. Symptoms improved. Will treat for possible mild PID, BV, and d/c with return precautions for undifferentiated abdominal pain. Pt in agreement.  Final Clinical Impressions(s) / ED Diagnoses   Final diagnoses:  RLQ abdominal pain  Acute bilateral low back pain without sciatica  Pelvic pain  BV (bacterial  vaginosis)    New Prescriptions Discharge Medication List as of 11/16/2015 11:52 AM    START taking these medications   Details  doxycycline (VIBRAMYCIN) 100 MG capsule Take 1 capsule (100 mg total) by mouth 2 (two) times daily., Starting Tue 11/16/2015, Until Tue 11/30/2015, Print    metroNIDAZOLE (FLAGYL) 500 MG tablet Take 1 tablet (500 mg total) by mouth 2 (two) times daily., Starting Tue 11/16/2015, Until Tue 11/23/2015, Print    naproxen (NAPROSYN) 375 MG tablet Take 1 tablet (375 mg total) by mouth 2 (two) times daily as needed for moderate pain., Starting Tue 11/16/2015, Until Tue 11/23/2015, Print         Shaune Pollack, MD 11/16/15 2015

## 2015-11-16 NOTE — ED Triage Notes (Signed)
Pt reports bilateral back pain x 1 week, at times radiating to her rlq "a pinching feeling". Pt also reports some subjective temps. Pt also reports "irritation" and frequency.

## 2015-11-17 LAB — GC/CHLAMYDIA PROBE AMP (~~LOC~~) NOT AT ARMC
Chlamydia: NEGATIVE
NEISSERIA GONORRHEA: NEGATIVE

## 2016-02-06 ENCOUNTER — Emergency Department (HOSPITAL_BASED_OUTPATIENT_CLINIC_OR_DEPARTMENT_OTHER): Payer: Self-pay

## 2016-02-06 ENCOUNTER — Emergency Department (HOSPITAL_BASED_OUTPATIENT_CLINIC_OR_DEPARTMENT_OTHER)
Admission: EM | Admit: 2016-02-06 | Discharge: 2016-02-06 | Disposition: A | Discharge status: Home / Self Care | Payer: Self-pay | Attending: Emergency Medicine | Admitting: Emergency Medicine

## 2016-02-06 ENCOUNTER — Encounter (HOSPITAL_BASED_OUTPATIENT_CLINIC_OR_DEPARTMENT_OTHER): Payer: Self-pay | Admitting: *Deleted

## 2016-02-06 DIAGNOSIS — B9789 Other viral agents as the cause of diseases classified elsewhere: Secondary | ICD-10-CM

## 2016-02-06 DIAGNOSIS — F17210 Nicotine dependence, cigarettes, uncomplicated: Secondary | ICD-10-CM | POA: Insufficient documentation

## 2016-02-06 DIAGNOSIS — J069 Acute upper respiratory infection, unspecified: Secondary | ICD-10-CM | POA: Insufficient documentation

## 2016-02-06 MED ORDER — GUAIFENESIN ER 1200 MG PO TB12: 1.0000 | ORAL_TABLET | Freq: Two times a day (BID) | ORAL | 0 refills | Status: AC

## 2016-02-06 MED ORDER — PROMETHAZINE-DM 6.25-15 MG/5ML PO SYRP: 5.0000 mL | ORAL_SOLUTION | Freq: Four times a day (QID) | ORAL | 0 refills | Status: AC | PRN

## 2016-02-06 MED ORDER — IBUPROFEN 800 MG PO TABS: 800.0000 mg | ORAL_TABLET | Freq: Three times a day (TID) | ORAL | 0 refills | Status: DC | PRN

## 2016-02-06 NOTE — Discharge Instructions (Signed)
Increase your fluid intake and rest as much as possible.  Return here as needed.  You Have an upper respiratory illness that can last for up to 2 weeks.  Follow-up with your primary care doctor

## 2016-02-06 NOTE — ED Provider Notes (Signed)
[facility]   CSN: [id number] Arrival date & time: 02/06/16  1044     History   Chief Complaint Chief Complaint  Patient presents with  . Cough    HPI Tara DanasYamech Cherry is a 30 y.o. female.  HPI Patient presents to the emergency department with nasal congestion, sore throat, cough, runny nose, chills over the last week.  The patient states that she has taken over-the-counter medications without significant relief of her symptoms.  Patient states that nothing to make the condition better or worse. The patient denies chest pain, shortness of breath, headache,blurred vision, neck pain, fever,  weakness, numbness, dizziness, anorexia, edema, abdominal pain, nausea, vomiting, diarrhea, rash, back pain, dysuria, hematemesis, bloody stool, near syncope, or syncope. Past Medical History:  Diagnosis Date  . Migraines   . Migraines     There are no active problems to display for this patient.   Past Surgical History:  Procedure Laterality Date  . CESAREAN SECTION    . TUBAL LIGATION      OB History    No data available       Home Medications    Prior to Admission medications   Medication Sig Start Date End Date Taking? Authorizing Provider  fexofenadine (ALLEGRA) 180 MG tablet Take 180 mg by mouth daily.   Yes Historical Provider, MD  ibuprofen (ADVIL,MOTRIN) 600 MG tablet Take 1 tablet (600 mg total) by mouth every 6 (six) hours as needed. 03/07/13   Shon Batonourtney F Horton, MD  ondansetron (ZOFRAN ODT) 4 MG disintegrating tablet Take 1 tablet (4 mg total) by mouth every 8 (eight) hours as needed for nausea or vomiting. 11/16/15   Shaune Pollackameron Isaacs, MD    Family History No family history on file.  Social History Social History  Substance Use Topics  . Smoking status: Current Every Day Smoker    Packs/day: 0.25    Types: Cigarettes  . Smokeless tobacco: Never Used  . Alcohol use Yes     Comment: occ yearly     Allergies   Levaquin [levofloxacin in  d5w]   Review of Systems Review of Systems All other systems negative except as documented in the HPI. All pertinent positives and negatives as reviewed in the HPI.  Physical Exam Updated Vital Signs BP 113/68 (BP Location: Right Arm)   Pulse 68   Temp 98.3 F (36.8 C) (Oral)   Resp 16   Ht 4\' 11"  (1.499 m)   Wt 72.6 kg   LMP 01/27/2016 Comment: lead shielding provided//a.c.  SpO2 100%   BMI 32.32 kg/m   Physical Exam  Constitutional: She is oriented to person, place, and time. She appears well-developed and well-nourished. No distress.  HENT:  Head: Normocephalic and atraumatic.  Nose: Mucosal edema present.  Mouth/Throat: Oropharynx is clear and moist.  Eyes: Pupils are equal, round, and reactive to light.  Neck: Normal range of motion. Neck supple.  Cardiovascular: Normal rate, regular rhythm and normal heart sounds.  Exam reveals no gallop and no friction rub.   No murmur heard. Pulmonary/Chest: Effort normal and breath sounds normal. No respiratory distress. She has no wheezes.  Abdominal: Soft. Bowel sounds are normal. She exhibits no distension. There is no tenderness.  Neurological: She is alert and oriented to person, place, and time. She exhibits normal muscle tone. Coordination normal.  Skin: Skin is warm and dry. No rash noted. No erythema.  Psychiatric: She has a normal mood and affect. Her behavior is normal.  Nursing note  and vitals reviewed.    ED Treatments / Results  Labs (all labs ordered are listed, but only abnormal results are displayed) Labs Reviewed - No data to display  EKG  EKG Interpretation None       Radiology Dg Chest 2 View  Result Date: 02/06/2016 CLINICAL DATA:  Sore throat, cough, congestion and fever for several days. EXAM: CHEST  2 VIEW COMPARISON:  PA and lateral chest 08/30/2014. FINDINGS: Lungs clear. Heart size normal. No pneumothorax or pleural fluid. No bony abnormality IMPRESSION: Normal chest. Electronically Signed    By: Drusilla Kannerhomas  Dalessio M.D.   On: 02/06/2016 12:00    Procedures Procedures (including critical care time)  Medications Ordered in ED Medications - No data to display   Initial Impression / Assessment and Plan / ED Course  I have reviewed the triage vital signs and the nursing notes.  Pertinent labs & imaging results that were available during my care of the patient were reviewed by me and considered in my medical decision making (see chart for details).  Clinical Course    Patient retreated for viral URI.  Told to return here as needed.  Patient agrees the plan and all questions were answered  Final Clinical Impressions(s) / ED Diagnoses   Final diagnoses:  None    New Prescriptions New Prescriptions   No medications on file     Charlestine NightChristopher Keelia Graybill, PA-C 02/06/16 1225    Arby BarretteMarcy Pfeiffer, MD 02/11/16 [phone]

## 2016-02-06 NOTE — ED Triage Notes (Signed)
Pt reports nasal congestion, sore throat, nausea and productive cough for over 1wk; denies fever, vomiting. Reports transient diarrhea that's resolved.

## 2016-06-15 ENCOUNTER — Encounter (HOSPITAL_BASED_OUTPATIENT_CLINIC_OR_DEPARTMENT_OTHER): Payer: Self-pay | Admitting: *Deleted

## 2016-06-15 ENCOUNTER — Emergency Department (HOSPITAL_BASED_OUTPATIENT_CLINIC_OR_DEPARTMENT_OTHER)
Admission: EM | Admit: 2016-06-15 | Discharge: 2016-06-15 | Disposition: A | Discharge status: Home / Self Care | Payer: 59 | Attending: Emergency Medicine | Admitting: Emergency Medicine

## 2016-06-15 DIAGNOSIS — N760 Acute vaginitis: Secondary | ICD-10-CM | POA: Insufficient documentation

## 2016-06-15 DIAGNOSIS — Z79899 Other long term (current) drug therapy: Secondary | ICD-10-CM | POA: Diagnosis not present

## 2016-06-15 DIAGNOSIS — S3992XA Unspecified injury of lower back, initial encounter: Secondary | ICD-10-CM | POA: Diagnosis present

## 2016-06-15 DIAGNOSIS — L02224 Furuncle of groin: Secondary | ICD-10-CM | POA: Insufficient documentation

## 2016-06-15 DIAGNOSIS — S39012A Strain of muscle, fascia and tendon of lower back, initial encounter: Secondary | ICD-10-CM | POA: Insufficient documentation

## 2016-06-15 DIAGNOSIS — Y9389 Activity, other specified: Secondary | ICD-10-CM | POA: Insufficient documentation

## 2016-06-15 DIAGNOSIS — Y990 Civilian activity done for income or pay: Secondary | ICD-10-CM | POA: Insufficient documentation

## 2016-06-15 DIAGNOSIS — L739 Follicular disorder, unspecified: Secondary | ICD-10-CM

## 2016-06-15 DIAGNOSIS — Y929 Unspecified place or not applicable: Secondary | ICD-10-CM | POA: Diagnosis not present

## 2016-06-15 DIAGNOSIS — Z791 Long term (current) use of non-steroidal anti-inflammatories (NSAID): Secondary | ICD-10-CM | POA: Diagnosis not present

## 2016-06-15 DIAGNOSIS — X500XXA Overexertion from strenuous movement or load, initial encounter: Secondary | ICD-10-CM | POA: Diagnosis not present

## 2016-06-15 DIAGNOSIS — F17210 Nicotine dependence, cigarettes, uncomplicated: Secondary | ICD-10-CM | POA: Insufficient documentation

## 2016-06-15 DIAGNOSIS — B9689 Other specified bacterial agents as the cause of diseases classified elsewhere: Secondary | ICD-10-CM

## 2016-06-15 LAB — URINALYSIS, ROUTINE W REFLEX MICROSCOPIC
Bilirubin Urine: NEGATIVE
GLUCOSE, UA: NEGATIVE mg/dL
Hgb urine dipstick: NEGATIVE
Ketones, ur: NEGATIVE mg/dL
LEUKOCYTES UA: NEGATIVE
Nitrite: NEGATIVE
PH: 6.5 (ref 5.0–8.0)
Protein, ur: NEGATIVE mg/dL
Specific Gravity, Urine: 1.024 (ref 1.005–1.030)

## 2016-06-15 LAB — WET PREP, GENITAL
Sperm: NONE SEEN
Trich, Wet Prep: NONE SEEN
YEAST WET PREP: NONE SEEN

## 2016-06-15 LAB — PREGNANCY, URINE: PREG TEST UR: NEGATIVE

## 2016-06-15 MED ORDER — KETOROLAC TROMETHAMINE 30 MG/ML IJ SOLN
30.0000 mg | Freq: Once | INTRAMUSCULAR | Status: AC
  Administered 2016-06-15: 30 mg via INTRAMUSCULAR
  Filled 2016-06-15: qty 1

## 2016-06-15 MED ORDER — SULFAMETHOXAZOLE-TRIMETHOPRIM 800-160 MG PO TABS: 1.0000 | ORAL_TABLET | Freq: Two times a day (BID) | ORAL | 0 refills | Status: AC

## 2016-06-15 MED ORDER — METRONIDAZOLE 500 MG PO TABS: 500.0000 mg | ORAL_TABLET | Freq: Two times a day (BID) | ORAL | 0 refills | Status: AC

## 2016-06-15 MED ORDER — METRONIDAZOLE 0.75 % VA GEL: 1.0000 | Freq: Every day | VAGINAL | 0 refills | Status: AC

## 2016-06-15 MED ORDER — CYCLOBENZAPRINE HCL 10 MG PO TABS: 10.0000 mg | ORAL_TABLET | Freq: Two times a day (BID) | ORAL | 0 refills | Status: DC | PRN

## 2016-06-15 NOTE — ED Provider Notes (Addendum)
[facility] Provider Note   CSN: [id number] Arrival date & time: 06/15/16  0807     History   Chief Complaint Chief Complaint  Patient presents with  . Back Pain    HPI Tara Cherry is a 31 y.o. female.  Pt presents to the ED today with back pain and "hair bumps" in her groin area.  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  She has been taking ibuprofen for her pain, but it has not been helping.  The pt said she shaved her pubic hair a few days ago, and noticed the bumps then.      Past Medical History:  Diagnosis Date  . Migraines   . Migraines     There are no active problems to display for this patient.   Past Surgical History:  Procedure Laterality Date  . CESAREAN SECTION    . TUBAL LIGATION      OB History    No data available       Home Medications    Prior to Admission medications   Medication Sig Start Date End Date Taking? Authorizing Provider  cyclobenzaprine (FLEXERIL) 10 MG tablet Take 1 tablet (10 mg total) by mouth 2 (two) times daily as needed for muscle spasms. 06/15/16   Jacalyn LefevreJulie Phylis Javed, MD  fexofenadine (ALLEGRA) 180 MG tablet Take 180 mg by mouth daily.    Historical Provider, MD  Guaifenesin 1200 MG TB12 Take 1 tablet (1,200 mg total) by mouth 2 (two) times daily. 02/06/16   Charlestine Nighthristopher Lawyer, PA-C  ibuprofen (ADVIL,MOTRIN) 800 MG tablet Take 1 tablet (800 mg total) by mouth every 8 (eight) hours as needed. 02/06/16   Charlestine Nighthristopher Lawyer, PA-C  metroNIDAZOLE (FLAGYL) 500 MG tablet Take 1 tablet (500 mg total) by mouth 2 (two) times daily. 06/15/16   Jacalyn LefevreJulie Sally Menard, MD  metroNIDAZOLE (METROGEL) 0.75 % vaginal gel Place 1 Applicatorful vaginally at bedtime. For 5 days 06/15/16   Jacalyn LefevreJulie Hershy Flenner, MD  ondansetron (ZOFRAN ODT) 4 MG disintegrating tablet Take 1 tablet (4 mg total) by mouth every 8 (eight) hours as needed for nausea or vomiting. 11/16/15   Shaune Pollackameron Isaacs, MD  promethazine-dextromethorphan  (PROMETHAZINE-DM) 6.25-15 MG/5ML syrup Take 5 mLs by mouth 4 (four) times daily as needed for cough. 02/06/16   Charlestine Nighthristopher Lawyer, PA-C  sulfamethoxazole-trimethoprim (BACTRIM DS,SEPTRA DS) 800-160 MG tablet Take 1 tablet by mouth 2 (two) times daily. 06/15/16 06/22/16  Jacalyn LefevreJulie Azarius Lambson, MD    Family History History reviewed. No pertinent family history.  Social History Social History  Substance Use Topics  . Smoking status: Current Every Day Smoker    Packs/day: 0.25    Types: Cigarettes  . Smokeless tobacco: Never Used  . Alcohol use Yes     Comment: occ yearly     Allergies   Levaquin [levofloxacin in d5w]   Review of Systems Review of Systems  Musculoskeletal: Positive for back pain.  Skin: Positive for rash.  All other systems reviewed and are negative.    Physical Exam Updated Vital Signs BP 104/63   Pulse 97   Temp 99.1 F (37.3 C) (Oral)   Resp 18   Ht 4\' 11"  (1.499 m)   Wt 158 lb (71.7 kg)   LMP 05/11/2016   SpO2 100%   BMI 31.91 kg/m   Physical Exam  Constitutional: She is oriented to person, place, and time. She appears well-developed and well-nourished.  HENT:  Head: Normocephalic and atraumatic.  Right  Ear: External ear normal.  Left Ear: External ear normal.  Nose: Nose normal.  Mouth/Throat: Oropharynx is clear and moist.  Eyes: Conjunctivae and EOM are normal. Pupils are equal, round, and reactive to light.  Neck: Normal range of motion. Neck supple.  Cardiovascular: Normal rate, regular rhythm, normal heart sounds and intact distal pulses.   Pulmonary/Chest: Effort normal and breath sounds normal.  Abdominal: Soft. Bowel sounds are normal.  Genitourinary: Vaginal discharge found.  Genitourinary Comments: Pt has multiple small bumps around the introitus.   Musculoskeletal:       Lumbar back: She exhibits spasm. She exhibits no bony tenderness.  Neurological: She is alert and oriented to person, place, and time.  Skin: Skin is warm.    Psychiatric: She has a normal mood and affect. Her behavior is normal. Judgment and thought content normal.  Nursing note and vitals reviewed.    ED Treatments / Results  Labs (all labs ordered are listed, but only abnormal results are displayed) Labs Reviewed  WET PREP, GENITAL - Abnormal; Notable for the following:       Result Value   Clue Cells Wet Prep HPF POC PRESENT (*)    WBC, Wet Prep HPF POC MANY (*)    All other components within normal limits  URINALYSIS, ROUTINE W REFLEX MICROSCOPIC  PREGNANCY, URINE  GC/CHLAMYDIA PROBE AMP ([place]) NOT AT [facility]    EKG  EKG Interpretation None       Radiology No results found.  Procedures Procedures (including critical care time)  Medications Ordered in ED Medications  ketorolac (TORADOL) 30 MG/ML injection 30 mg (30 mg Intramuscular Given 06/15/16 0919)     Initial Impression / Assessment and Plan / ED Course  I have reviewed the triage vital signs and the nursing notes.  Pertinent labs & imaging results that were available during my care of the patient were reviewed by me and considered in my medical decision making (see chart for details).     I strongly suspect bumps around introitus are genital warts.  However, pt said they started after shaving, so they may be a folliculitis.  Pt will be treated with folliculitis with bactrim and told to f/u with gyn.  Pt is told to not have unprotected sex.    After pt given rx for flagyl, pt told the nurse she had side effects.  She was then given the rx for the vaginal gel.   Final Clinical Impressions(s) / ED Diagnoses   Final diagnoses:  Strain of lumbar region, initial encounter  BV (bacterial vaginosis)  Folliculitis    New Prescriptions New Prescriptions   CYCLOBENZAPRINE (FLEXERIL) 10 MG TABLET    Take 1 tablet (10 mg total) by mouth 2 (two) times daily as needed for muscle spasms.   METRONIDAZOLE (FLAGYL) 500 MG TABLET    Take 1 tablet (500 mg total) by  mouth 2 (two) times daily.   METRONIDAZOLE (METROGEL) 0.75 % VAGINAL GEL    Place 1 Applicatorful vaginally at bedtime. For 5 days   SULFAMETHOXAZOLE-TRIMETHOPRIM (BACTRIM DS,SEPTRA DS) 800-160 MG TABLET    Take 1 tablet by mouth 2 (two) times daily.     Jacalyn Lefevre, MD 06/15/16 1610    Jacalyn Lefevre, MD 06/15/16 1014

## 2016-06-15 NOTE — ED Triage Notes (Signed)
Pt reports low back pain x 1 week, "tightness", states she lifts heavy boxes at work but does not recall any specific instance of injury or trauma. Pt states she also has some "hair bumps" to her groin area she wants the provider to check.

## 2016-06-16 LAB — GC/CHLAMYDIA PROBE AMP (~~LOC~~) NOT AT ARMC
CHLAMYDIA, DNA PROBE: NEGATIVE
NEISSERIA GONORRHEA: NEGATIVE

## 2017-04-20 ENCOUNTER — Encounter (HOSPITAL_BASED_OUTPATIENT_CLINIC_OR_DEPARTMENT_OTHER): Payer: Self-pay

## 2017-04-20 ENCOUNTER — Other Ambulatory Visit: Payer: Self-pay

## 2017-04-20 ENCOUNTER — Emergency Department (HOSPITAL_BASED_OUTPATIENT_CLINIC_OR_DEPARTMENT_OTHER)
Admission: EM | Admit: 2017-04-20 | Discharge: 2017-04-20 | Disposition: A | Discharge status: Home / Self Care | Payer: Medicaid Other | Attending: Emergency Medicine | Admitting: Emergency Medicine

## 2017-04-20 ENCOUNTER — Emergency Department (HOSPITAL_BASED_OUTPATIENT_CLINIC_OR_DEPARTMENT_OTHER): Payer: Medicaid Other

## 2017-04-20 DIAGNOSIS — R110 Nausea: Secondary | ICD-10-CM | POA: Diagnosis not present

## 2017-04-20 DIAGNOSIS — E876 Hypokalemia: Secondary | ICD-10-CM | POA: Insufficient documentation

## 2017-04-20 DIAGNOSIS — R079 Chest pain, unspecified: Secondary | ICD-10-CM | POA: Diagnosis present

## 2017-04-20 DIAGNOSIS — R0789 Other chest pain: Secondary | ICD-10-CM | POA: Insufficient documentation

## 2017-04-20 DIAGNOSIS — F17210 Nicotine dependence, cigarettes, uncomplicated: Secondary | ICD-10-CM | POA: Insufficient documentation

## 2017-04-20 LAB — CBC WITH DIFFERENTIAL/PLATELET
BASOS ABS: 0 10*3/uL (ref 0.0–0.1)
BASOS PCT: 0 %
EOS ABS: 0.1 10*3/uL (ref 0.0–0.7)
EOS PCT: 2 %
HCT: 42.2 % (ref 36.0–46.0)
HEMOGLOBIN: 14.5 g/dL (ref 12.0–15.0)
Lymphocytes Relative: 55 %
Lymphs Abs: 2.4 10*3/uL (ref 0.7–4.0)
MCH: 31.7 pg (ref 26.0–34.0)
MCHC: 34.4 g/dL (ref 30.0–36.0)
MCV: 92.3 fL (ref 78.0–100.0)
Monocytes Absolute: 0.3 10*3/uL (ref 0.1–1.0)
Monocytes Relative: 6 %
NEUTROS PCT: 37 %
Neutro Abs: 1.6 10*3/uL — ABNORMAL LOW (ref 1.7–7.7)
PLATELETS: 211 10*3/uL (ref 150–400)
RBC: 4.57 MIL/uL (ref 3.87–5.11)
RDW: 11.7 % (ref 11.5–15.5)
WBC: 4.3 10*3/uL (ref 4.0–10.5)

## 2017-04-20 LAB — URINALYSIS, MICROSCOPIC (REFLEX)

## 2017-04-20 LAB — BASIC METABOLIC PANEL
ANION GAP: 8 (ref 5–15)
BUN: 11 mg/dL (ref 6–20)
CHLORIDE: 103 mmol/L (ref 101–111)
CO2: 26 mmol/L (ref 22–32)
Calcium: 9.1 mg/dL (ref 8.9–10.3)
Creatinine, Ser: 0.74 mg/dL (ref 0.44–1.00)
Glucose, Bld: 83 mg/dL (ref 65–99)
POTASSIUM: 3.3 mmol/L — AB (ref 3.5–5.1)
SODIUM: 137 mmol/L (ref 135–145)

## 2017-04-20 LAB — URINALYSIS, ROUTINE W REFLEX MICROSCOPIC
Bilirubin Urine: NEGATIVE
GLUCOSE, UA: NEGATIVE mg/dL
Ketones, ur: NEGATIVE mg/dL
LEUKOCYTES UA: NEGATIVE
NITRITE: NEGATIVE
PH: 6 (ref 5.0–8.0)
PROTEIN: NEGATIVE mg/dL
Specific Gravity, Urine: <1.005 — ABNORMAL LOW (ref 1.005–1.030)

## 2017-04-20 LAB — TROPONIN I: Troponin I: <0.03 ng/mL (ref <0.03)

## 2017-04-20 LAB — D-DIMER, QUANTITATIVE (NOT AT ARMC)

## 2017-04-20 MED ORDER — METHOCARBAMOL 500 MG PO TABS: 500.0000 mg | ORAL_TABLET | Freq: Every evening | ORAL | 0 refills | Status: DC | PRN

## 2017-04-20 MED ORDER — POTASSIUM CHLORIDE CRYS ER 20 MEQ PO TBCR
40.0000 meq | EXTENDED_RELEASE_TABLET | Freq: Once | ORAL | Status: AC
  Administered 2017-04-20: 40 meq via ORAL
  Filled 2017-04-20: qty 2

## 2017-04-20 MED ORDER — IBUPROFEN 800 MG PO TABS: 800.0000 mg | ORAL_TABLET | Freq: Three times a day (TID) | ORAL | 0 refills | Status: AC

## 2017-04-20 MED ORDER — NAPROXEN 375 MG PO TABS: 375.0000 mg | ORAL_TABLET | Freq: Two times a day (BID) | ORAL | 0 refills | Status: DC

## 2017-04-20 MED ORDER — POTASSIUM CHLORIDE CRYS ER 20 MEQ PO TBCR: 20.0000 meq | EXTENDED_RELEASE_TABLET | Freq: Every day | ORAL | 0 refills | Status: AC

## 2017-04-20 MED ORDER — SODIUM CHLORIDE 0.9 % IV BOLUS (SEPSIS)
500.0000 mL | Freq: Once | INTRAVENOUS | Status: AC
Start: 2017-04-20 — End: 2017-04-20
  Administered 2017-04-20: 500 mL via INTRAVENOUS

## 2017-04-20 NOTE — Discharge Instructions (Addendum)
As discussed, your chest xray was normal, your blood work was normal with the exception of slightly low potassium, your urine was normal, your EKG was unchanged and your exam was overall reassuring.  The medicine prescribed can help with muscle spasm but cannot be taken if driving, with alcohol or operating machinery. You may try to apply heat to the area. Ibuprofen as needed.  Follow up with your Primary care provider if symptoms  persist beyond a week.  Return if worsening or new concerning symptoms in the meantime.

## 2017-04-20 NOTE — ED Provider Notes (Signed)
[facility] EMERGENCY DEPARTMENT Provider Note   CSN: [id number] Arrival date & time: 04/20/17  6213     History   Chief Complaint Chief Complaint  Patient presents with  . Chest Pain  . Back Pain    HPI Tara Cherry is a 32 y.o. female with no significant past medical history presenting with 2 weeks of progressive onset left lower anterior pressure chest pain wrapping around to her back.  Her back discomfort is described as a deep burning pain reproducible when pressure is applied. She is unsure if there is any pleuritic component.  The pain is worse at night and aggravated by lying flat on her back and alleviated by sleeping on her stomach. She also reports associated nausea but no vomiting.  Was seen in urgent care approximately a week ago for the same and prescribed steroids and diagnosed with musculoskeletal pain possibly due to bra wire.  She states that she looked up the side effects of steroids and decided not to take them.  She denies any cough, fever, hemoptysis, shortness of breath, worsening on exertion, history of DVT/PE, prolonged immobilization, recent surgery, estrogen use, lower extremity edema or pain.  She denies any urinary symptoms but thought she could possibly be having a kidney stone.  Reports family history of nephrolithiasis but no personal history.  HPI  Past Medical History:  Diagnosis Date  . Migraines   . Migraines     There are no active problems to display for this patient.   Past Surgical History:  Procedure Laterality Date  . CESAREAN SECTION    . TUBAL LIGATION      OB History    No data available       Home Medications    Prior to Admission medications   Medication Sig Start Date End Date Taking? Authorizing Provider  cyclobenzaprine (FLEXERIL) 10 MG tablet Take 1 tablet (10 mg total) by mouth 2 (two) times daily as needed for muscle spasms. 06/15/16   Jacalyn Lefevre, MD  fexofenadine (ALLEGRA) 180 MG tablet Take 180 mg by  mouth daily.    [provider]  Guaifenesin 1200 MG TB12 Take 1 tablet (1,200 mg total) by mouth 2 (two) times daily. 02/06/16   Lawyer, Cristal Deer, PA-C  ibuprofen (ADVIL,MOTRIN) 800 MG tablet Take 1 tablet (800 mg total) by mouth 3 (three) times daily. 04/20/17   Georgiana Shore, PA-C  methocarbamol (ROBAXIN) 500 MG tablet Take 1 tablet (500 mg total) by mouth at bedtime as needed. 04/20/17   Georgiana Shore, PA-C  metroNIDAZOLE (FLAGYL) 500 MG tablet Take 1 tablet (500 mg total) by mouth 2 (two) times daily. 06/15/16   Jacalyn Lefevre, MD  metroNIDAZOLE (METROGEL) 0.75 % vaginal gel Place 1 Applicatorful vaginally at bedtime. For 5 days 06/15/16   Jacalyn Lefevre, MD  ondansetron (ZOFRAN ODT) 4 MG disintegrating tablet Take 1 tablet (4 mg total) by mouth every 8 (eight) hours as needed for nausea or vomiting. 11/16/15   Shaune Pollack, MD  potassium chloride SA (K-DUR,KLOR-CON) 20 MEQ tablet Take 1 tablet (20 mEq total) by mouth daily for 5 days. 04/20/17 04/25/17  Georgiana Shore, PA-C  promethazine-dextromethorphan (PROMETHAZINE-DM) 6.25-15 MG/5ML syrup Take 5 mLs by mouth 4 (four) times daily as needed for cough. 02/06/16   Charlestine Night, PA-C    Family History No family history on file.  Social History Social History   Tobacco Use  . Smoking status: Current Every Day Smoker    Packs/day: 0.25  Types: Cigarettes  . Smokeless tobacco: Never Used  Substance Use Topics  . Alcohol use: Yes    Comment: occ yearly  . Drug use: No     Allergies   Levaquin [levofloxacin in d5w]   Review of Systems Review of Systems  Constitutional: Positive for fatigue. Negative for chills, diaphoresis, fever and unexpected weight change.  HENT: Positive for sneezing. Negative for congestion, ear pain and sore throat.   Respiratory: Negative for cough, choking, chest tightness, shortness of breath, wheezing and stridor.   Cardiovascular: Positive for chest pain. Negative for  palpitations and leg swelling.  Gastrointestinal: Positive for nausea. Negative for abdominal distention, abdominal pain, diarrhea and vomiting.       LUQ pain deep under the rib cage  Genitourinary: Negative for decreased urine volume, difficulty urinating, dysuria, flank pain, frequency, hematuria and pelvic pain.  Musculoskeletal: Negative for gait problem, joint swelling, neck pain and neck stiffness.  Skin: Negative for color change, pallor and rash.  Neurological: Negative for dizziness, weakness, light-headedness and headaches.  Psychiatric/Behavioral: Negative for behavioral problems.     Physical Exam Updated Vital Signs BP 112/83 (BP Location: Left Arm)   Pulse 75   Temp 98.5 F (36.9 C) (Oral)   Resp 20   Ht 4\' 11"  (1.499 m)   Wt 76.2 kg (168 lb)   LMP 04/14/2017 (Within Days) Comment: tubal ligation   SpO2 98%   BMI 33.93 kg/m   Physical Exam  Constitutional: She is oriented to person, place, and time. She appears well-developed and well-nourished.  Non-toxic appearance. She does not appear ill. No distress.  Afebrile, nontoxic appearing in no acute distress, sitting comfortably in bed.  HENT:  Head: Atraumatic.  Eyes: EOM are normal.  Neck: Normal range of motion.  Cardiovascular: Regular rhythm. Tachycardia present.  Pulmonary/Chest: Effort normal and breath sounds normal. No accessory muscle usage or stridor. No respiratory distress. She has no decreased breath sounds. She has no wheezes. She has no rhonchi. She has no rales.  Abdominal: Soft. She exhibits no distension and no mass. There is no tenderness. There is no rebound and no guarding.  Musculoskeletal: Normal range of motion.       Right lower leg: Normal. She exhibits no tenderness and no edema.       Left lower leg: Normal. She exhibits no tenderness and no edema.  Neurological: She is alert and oriented to person, place, and time.  Skin: Skin is warm and dry. No rash noted. She is not diaphoretic. No  erythema. No pallor.  Psychiatric: She has a normal mood and affect. Her behavior is normal.  Nursing note and vitals reviewed.    ED Treatments / Results  Labs (all labs ordered are listed, but only abnormal results are displayed) Labs Reviewed  URINALYSIS, ROUTINE W REFLEX MICROSCOPIC - Abnormal; Notable for the following components:      Result Value   Specific Gravity, Urine <1.005 (*)    Hgb urine dipstick TRACE (*)    All other components within normal limits  CBC WITH DIFFERENTIAL/PLATELET - Abnormal; Notable for the following components:   Neutro Abs 1.6 (*)    All other components within normal limits  BASIC METABOLIC PANEL - Abnormal; Notable for the following components:   Potassium 3.3 (*)    All other components within normal limits  URINALYSIS, MICROSCOPIC (REFLEX) - Abnormal; Notable for the following components:   Bacteria, UA FEW (*)    Squamous Epithelial / LPF 0-5 (*)  All other components within normal limits  TROPONIN I  D-DIMER, QUANTITATIVE (NOT AT [facility])    EKG  EKG Interpretation  Date/Time:  Friday April 20 2017 10:10:42 EST Ventricular Rate:  104 PR Interval:    QRS Duration: 90 QT Interval:  311 QTC Calculation: 409 R Axis:   73 Text Interpretation:  Sinus tachycardia LAE, consider biatrial enlargement Baseline wander in lead(s) V6 inferior t inversions 3,F similar to prior 7/16 Confirmed by Meridee Score [phone]) on 04/20/2017 10:16:20 AM       Radiology Dg Chest 2 View  Result Date: 04/20/2017 CLINICAL DATA:  Left-sided chest pain for 2 weeks EXAM: CHEST - 2 VIEW COMPARISON:  02/06/2016 FINDINGS: Cardiac shadow is within normal limits. The lungs are well aerated bilaterally. No focal infiltrate or sizable effusion is seen. No bony abnormality is noted. IMPRESSION: No active cardiopulmonary disease. Electronically Signed   By: Alcide Clever M.D.   On: 04/20/2017 10:57    Procedures Procedures (including critical care time)  Medications  Ordered in ED Medications  sodium chloride 0.9 % bolus 500 mL (0 mLs Intravenous Stopped 04/20/17 1258)  potassium chloride SA (K-DUR,KLOR-CON) CR tablet 40 mEq (40 mEq Oral Given 04/20/17 1301)     Initial Impression / Assessment and Plan / ED Course  I have reviewed the triage vital signs and the nursing notes.  Pertinent labs & imaging results that were available during my care of the patient were reviewed by me and considered in my medical decision making (see chart for details).    Patient presents with 2 weeks of left lower chest pain wrapping around to her back. She is initially tachycardic and borderline tachypneic, but 100% O2 sats on room air and no respiratory distress. EKG with T-wave inversion in lead III remain unchanged from prior tracing.  Cannot use PERC due to tachycardia, Dimer negative  Chest Xray negative  Low suspicion for ACS, patient otherwise healthy with no co-morbidities or risk factors. No EKG changes, exertional component, sob or other concerning symptoms.  Given fluids and HR trending down. Pain reproducible on palpation and may be musculoskeletal in nature. No association with meals. Advised patient to avoid heavy caffeine, alcohol and spicy foods and to discuss symptoms with PCP if any association with foods if symptoms persist to possibly consider PPI trial.  Hypokalemic, given potassium. She is well-appearing nontoxic. Will dc home with symptomatic relief and close PCP follow up.  Discussed strict return precautions and advised to return to the emergency department if experiencing any new or worsening symptoms. Instructions were understood and patient agreed with discharge plan.  Final Clinical Impressions(s) / ED Diagnoses   Final diagnoses:  Hypokalemia  Chest wall pain    ED Discharge Orders        Ordered    potassium chloride SA (K-DUR,KLOR-CON) 20 MEQ tablet  Daily     04/20/17 1253    methocarbamol (ROBAXIN) 500 MG tablet  At bedtime PRN       04/20/17 1253    naproxen (NAPROSYN) 375 MG tablet  2 times daily,   Status:  Discontinued     04/20/17 1253    ibuprofen (ADVIL,MOTRIN) 800 MG tablet  3 times daily     04/20/17 1308       Gregary Cromer 04/20/17 1428    Terrilee Files, MD 04/23/17 1242

## 2017-04-20 NOTE — ED Triage Notes (Signed)
Pt reports chest pain under left rib cage area that radiates to the left side of her back. Pt reports this pain for about 2 weeks. Pt reports nausea also.

## 2017-07-20 ENCOUNTER — Encounter (HOSPITAL_BASED_OUTPATIENT_CLINIC_OR_DEPARTMENT_OTHER): Payer: Self-pay

## 2017-07-20 ENCOUNTER — Emergency Department (HOSPITAL_BASED_OUTPATIENT_CLINIC_OR_DEPARTMENT_OTHER)
Admission: EM | Admit: 2017-07-20 | Discharge: 2017-07-20 | Disposition: A | Discharge status: Home / Self Care | Payer: Medicaid Other | Attending: Emergency Medicine | Admitting: Emergency Medicine

## 2017-07-20 ENCOUNTER — Emergency Department (HOSPITAL_BASED_OUTPATIENT_CLINIC_OR_DEPARTMENT_OTHER): Payer: Medicaid Other

## 2017-07-20 ENCOUNTER — Other Ambulatory Visit: Payer: Self-pay

## 2017-07-20 DIAGNOSIS — S29019A Strain of muscle and tendon of unspecified wall of thorax, initial encounter: Secondary | ICD-10-CM

## 2017-07-20 DIAGNOSIS — Y999 Unspecified external cause status: Secondary | ICD-10-CM | POA: Insufficient documentation

## 2017-07-20 DIAGNOSIS — Z79899 Other long term (current) drug therapy: Secondary | ICD-10-CM | POA: Diagnosis not present

## 2017-07-20 DIAGNOSIS — X58XXXA Exposure to other specified factors, initial encounter: Secondary | ICD-10-CM | POA: Diagnosis not present

## 2017-07-20 DIAGNOSIS — M549 Dorsalgia, unspecified: Secondary | ICD-10-CM | POA: Diagnosis present

## 2017-07-20 DIAGNOSIS — Y939 Activity, unspecified: Secondary | ICD-10-CM | POA: Diagnosis not present

## 2017-07-20 DIAGNOSIS — Y929 Unspecified place or not applicable: Secondary | ICD-10-CM | POA: Diagnosis not present

## 2017-07-20 DIAGNOSIS — F17210 Nicotine dependence, cigarettes, uncomplicated: Secondary | ICD-10-CM | POA: Insufficient documentation

## 2017-07-20 LAB — URINALYSIS, ROUTINE W REFLEX MICROSCOPIC
BILIRUBIN URINE: NEGATIVE
GLUCOSE, UA: NEGATIVE mg/dL
HGB URINE DIPSTICK: NEGATIVE
KETONES UR: NEGATIVE mg/dL
LEUKOCYTES UA: NEGATIVE
Nitrite: NEGATIVE
PROTEIN: NEGATIVE mg/dL
Specific Gravity, Urine: >1.03 — ABNORMAL HIGH (ref 1.005–1.030)
pH: 6 (ref 5.0–8.0)

## 2017-07-20 MED ORDER — METHOCARBAMOL 500 MG PO TABS: 500.0000 mg | ORAL_TABLET | Freq: Two times a day (BID) | ORAL | 0 refills | Status: AC | PRN

## 2017-07-20 NOTE — ED Notes (Signed)
Pt requesting to have urine specimen ran

## 2017-07-20 NOTE — ED Notes (Signed)
ED Provider at bedside. 

## 2017-07-20 NOTE — ED Provider Notes (Signed)
[facility] EMERGENCY DEPARTMENT Provider Note   CSN: [id number] Arrival date & time: 07/20/17  0757     History   Chief Complaint Chief Complaint  Patient presents with  . Back Pain    HPI Tara Cherry is a 32 y.o. female.  The history is provided by the patient. No language interpreter was used.  Back Pain      Tara Cherry is a 32 y.o. female who presents to the Emergency Department complaining of back pain. She reports three months of left sided thoracic back pain. The pain wraps around to her left rib cage. Initially it was a dull sensation that was worse at night but now it is occasionally burning. It is worse when she is trying to lay on her back. She denies any fevers, shortness of breath, cough, abdominal pain, nausea, vomiting, dysuria, hematuria, numbness, weakness. No rash. She has a history of migraines, takes no medications. She tried ibuprofen at home and did have some mild relief with ibuprofen but stopped taking it because she didn't feel significant improvement.  Past Medical History:  Diagnosis Date  . Migraines   . Migraines     There are no active problems to display for this patient.   Past Surgical History:  Procedure Laterality Date  . CESAREAN SECTION    . TUBAL LIGATION       OB History   None      Home Medications    Prior to Admission medications   Medication Sig Start Date End Date Taking? Authorizing Provider  cyclobenzaprine (FLEXERIL) 10 MG tablet Take 1 tablet (10 mg total) by mouth 2 (two) times daily as needed for muscle spasms. 06/15/16   Jacalyn Lefevre, MD  fexofenadine (ALLEGRA) 180 MG tablet Take 180 mg by mouth daily.    [provider]  Guaifenesin 1200 MG TB12 Take 1 tablet (1,200 mg total) by mouth 2 (two) times daily. 02/06/16   Lawyer, Cristal Deer, PA-C  ibuprofen (ADVIL,MOTRIN) 800 MG tablet Take 1 tablet (800 mg total) by mouth 3 (three) times daily. 04/20/17   Georgiana Shore, PA-C    methocarbamol (ROBAXIN) 500 MG tablet Take 1 tablet (500 mg total) by mouth 2 (two) times daily as needed for muscle spasms. 07/20/17   Tilden Fossa, MD  metroNIDAZOLE (FLAGYL) 500 MG tablet Take 1 tablet (500 mg total) by mouth 2 (two) times daily. 06/15/16   Jacalyn Lefevre, MD  metroNIDAZOLE (METROGEL) 0.75 % vaginal gel Place 1 Applicatorful vaginally at bedtime. For 5 days 06/15/16   Jacalyn Lefevre, MD  ondansetron (ZOFRAN ODT) 4 MG disintegrating tablet Take 1 tablet (4 mg total) by mouth every 8 (eight) hours as needed for nausea or vomiting. 11/16/15   Shaune Pollack, MD  potassium chloride SA (K-DUR,KLOR-CON) 20 MEQ tablet Take 1 tablet (20 mEq total) by mouth daily for 5 days. 04/20/17 04/25/17  Georgiana Shore, PA-C  promethazine-dextromethorphan (PROMETHAZINE-DM) 6.25-15 MG/5ML syrup Take 5 mLs by mouth 4 (four) times daily as needed for cough. 02/06/16   Charlestine Night, PA-C    Family History No family history on file.  Social History Social History   Tobacco Use  . Smoking status: Current Every Day Smoker    Packs/day: 0.25    Types: Cigarettes  . Smokeless tobacco: Never Used  Substance Use Topics  . Alcohol use: Yes    Comment: occ yearly  . Drug use: No     Allergies   Levaquin [levofloxacin in d5w]   Review  of Systems Review of Systems  Musculoskeletal: Positive for back pain.  All other systems reviewed and are negative.    Physical Exam Updated Vital Signs BP 107/70   Pulse 72   Temp 98.2 F (36.8 C) (Oral)   Resp 19   Ht 4\' 11"  (1.499 m)   Wt 73.5 kg (162 lb)   LMP 07/07/2017   SpO2 100%   BMI 32.72 kg/m   Physical Exam  Constitutional: She is oriented to person, place, and time. She appears well-developed and well-nourished.  HENT:  Head: Normocephalic and atraumatic.  Cardiovascular: Normal rate and regular rhythm.  No murmur heard. Pulmonary/Chest: Effort normal and breath sounds normal. No respiratory distress.  TTP over left  infrascapular back, left lower rib margin anteriorly.  Abdominal: Soft. There is no tenderness. There is no rebound and no guarding.  No CVA tenderness  Musculoskeletal: She exhibits no edema or tenderness.  Neurological: She is alert and oriented to person, place, and time.  Skin: Skin is warm and dry.  Psychiatric: She has a normal mood and affect. Her behavior is normal.  Nursing note and vitals reviewed.    ED Treatments / Results  Labs (all labs ordered are listed, but only abnormal results are displayed) Labs Reviewed  URINALYSIS, ROUTINE W REFLEX MICROSCOPIC - Abnormal; Notable for the following components:      Result Value   APPearance HAZY (*)    Specific Gravity, Urine >1.030 (*)    All other components within normal limits    EKG EKG Interpretation  Date/Time:  Friday July 20 2017 09:16:29 EDT Ventricular Rate:  73 PR Interval:    QRS Duration: 90 QT Interval:  343 QTC Calculation: 378 R Axis:   62 Text Interpretation:  Sinus rhythm Since last tracing rate slower Otherwise no significant change Confirmed by Tilden Fossaees, Arisbel Maione [phone]([id number]) on 07/20/2017 9:23:18 AM   Radiology Dg Ribs Unilateral W/chest Left  Result Date: 07/20/2017 CLINICAL DATA:  Pain for approximately 3 months. EXAM: LEFT RIBS AND CHEST - 3+ VIEW COMPARISON:  Chest radiograph April 20, 2017 FINDINGS: Frontal chest as well as oblique and cone-down rib images were obtained. Lungs are clear. Heart size and pulmonary vascularity are normal. No adenopathy. There is no evident rib fracture. No pneumothorax or pleural effusion. IMPRESSION: No evident fracture.  Lungs clear. Electronically Signed   By: Bretta BangWilliam  Woodruff III M.D.   On: 07/20/2017 09:44    Procedures Procedures (including critical care time)  Medications Ordered in ED Medications - No data to display   Initial Impression / Assessment and Plan / ED Course  I have reviewed the triage vital signs and the nursing notes.  Pertinent labs & imaging  results that were available during my care of the patient were reviewed by me and considered in my medical decision making (see chart for details).     Patient here for evaluation of several months of left sided back and abdominal/chest pain. Pain is located over her costal margin. No rash concerning for zoster. Presentation is not consistent with ACS, PE, dissection, renal colic, UTI, splenic infarction. Discussed with patient home care for musculoskeletal pain. Discussed outpatient follow-up and return precautions.  Final Clinical Impressions(s) / ED Diagnoses   Final diagnoses:  Thoracic myofascial strain, initial encounter    ED Discharge Orders        Ordered    methocarbamol (ROBAXIN) 500 MG tablet  2 times daily PRN     07/20/17 1030  Tilden Fossa, MD 07/20/17 1452

## 2017-07-20 NOTE — ED Notes (Signed)
Pt ambulated to XR at this time.

## 2017-07-20 NOTE — ED Triage Notes (Signed)
Left sided back pain (burning and pressure) radiating around to left ribs. Over 2-3 months. No nausea, vomiting. Becoming more constant

## 2017-07-20 NOTE — ED Notes (Signed)
Pt returned from XR at this time. 

## 2017-07-20 NOTE — ED Notes (Signed)
Patient transported to X-ray 

## 2017-07-20 NOTE — Discharge Instructions (Addendum)
You can take zantac, available over the counter for indigestion.  Take aleve (naproxen) and tylenol according to label instructions for pain.  You can use a Salonpas patch for pain.  Do not take ibuprofen with aleve.

## 2017-11-15 ENCOUNTER — Emergency Department (HOSPITAL_BASED_OUTPATIENT_CLINIC_OR_DEPARTMENT_OTHER)
Admission: EM | Admit: 2017-11-15 | Discharge: 2017-11-15 | Discharge status: Against Medical Advice | Payer: Medicaid Other

## 2017-12-06 ENCOUNTER — Emergency Department (HOSPITAL_BASED_OUTPATIENT_CLINIC_OR_DEPARTMENT_OTHER)
Admission: EM | Admit: 2017-12-06 | Discharge: 2017-12-06 | Disposition: A | Discharge status: Home / Self Care | Payer: Medicaid Other | Attending: Emergency Medicine | Admitting: Emergency Medicine

## 2017-12-06 ENCOUNTER — Emergency Department (HOSPITAL_BASED_OUTPATIENT_CLINIC_OR_DEPARTMENT_OTHER): Payer: Medicaid Other

## 2017-12-06 ENCOUNTER — Encounter (HOSPITAL_BASED_OUTPATIENT_CLINIC_OR_DEPARTMENT_OTHER): Payer: Self-pay | Admitting: *Deleted

## 2017-12-06 ENCOUNTER — Other Ambulatory Visit: Payer: Self-pay

## 2017-12-06 DIAGNOSIS — R0789 Other chest pain: Secondary | ICD-10-CM | POA: Insufficient documentation

## 2017-12-06 DIAGNOSIS — F17210 Nicotine dependence, cigarettes, uncomplicated: Secondary | ICD-10-CM | POA: Diagnosis not present

## 2017-12-06 DIAGNOSIS — Z79899 Other long term (current) drug therapy: Secondary | ICD-10-CM | POA: Insufficient documentation

## 2017-12-06 LAB — COMPREHENSIVE METABOLIC PANEL
ALBUMIN: 4.2 g/dL (ref 3.5–5.0)
ALT: 13 U/L (ref 0–44)
AST: 17 U/L (ref 15–41)
Alkaline Phosphatase: 41 U/L (ref 38–126)
Anion gap: 11 (ref 5–15)
BUN: 7 mg/dL (ref 6–20)
CALCIUM: 9.3 mg/dL (ref 8.9–10.3)
CHLORIDE: 104 mmol/L (ref 98–111)
CO2: 23 mmol/L (ref 22–32)
Creatinine, Ser: 0.67 mg/dL (ref 0.44–1.00)
GFR calc non Af Amer: >60 mL/min (ref >60)
GLUCOSE: 81 mg/dL (ref 70–99)
POTASSIUM: 3.7 mmol/L (ref 3.5–5.1)
Sodium: 138 mmol/L (ref 135–145)
Total Bilirubin: 0.4 mg/dL (ref 0.3–1.2)
Total Protein: 7.5 g/dL (ref 6.5–8.1)

## 2017-12-06 LAB — CBC
HCT: 40.9 % (ref 36.0–46.0)
HEMOGLOBIN: 13.5 g/dL (ref 12.0–15.0)
MCH: 31.1 pg (ref 26.0–34.0)
MCHC: 33 g/dL (ref 30.0–36.0)
MCV: 94.2 fL (ref 80.0–100.0)
PLATELETS: 195 10*3/uL (ref 150–400)
RBC: 4.34 MIL/uL (ref 3.87–5.11)
RDW: 11.8 % (ref 11.5–15.5)
WBC: 4.9 10*3/uL (ref 4.0–10.5)
nRBC: 0 % (ref 0.0–0.2)

## 2017-12-06 LAB — D-DIMER, QUANTITATIVE (NOT AT ARMC): D DIMER QUANT: 0.36 ug{FEU}/mL (ref 0.00–0.50)

## 2017-12-06 LAB — TROPONIN I

## 2017-12-06 LAB — LIPASE, BLOOD: Lipase: 24 U/L (ref 11–51)

## 2017-12-06 MED ORDER — OMEPRAZOLE 20 MG PO CPDR: 20.0000 mg | DELAYED_RELEASE_CAPSULE | Freq: Every day | ORAL | 0 refills | Status: AC

## 2017-12-06 MED ORDER — MELOXICAM 7.5 MG PO TABS: 7.5000 mg | ORAL_TABLET | Freq: Every day | ORAL | 0 refills | Status: AC

## 2017-12-06 NOTE — ED Provider Notes (Signed)
[facility] EMERGENCY DEPARTMENT Provider Note   CSN: [id number] Arrival date & time: 12/06/17  1042     History   Chief Complaint Chief Complaint  Patient presents with  . Back Pain    HPI Tara Cherry is a 32 y.o. female.  Patient presents with complaint of left-sided chest pain in her left rib cage, left breast, left middle back which she states has been ongoing for the past 3 months.  Records show that patient was seen in 07/2017 for similar symptoms her symptoms probably predate that.  She complains of pain that is sharp in nature and worse with movement and palpation.  She states that sometimes she feels short of breath.  She denies any recent injuries.  Pain does get worse with deep breathing as well.  No abdominal pain, fever, nausea, vomiting, or diarrhea.  Patient has a very strong family history of blood clots and she is concerned about this.  Records show negative d-dimer in 04/2017. Patient denies risk factors for pulmonary embolism including: unilateral leg swelling, history of DVT/PE/other blood clots, use of exogenous hormones, recent immobilizations, recent surgery, recent travel (>4hr segment), malignancy, hemoptysis.  She is a smoker.  Otherwise no history of hypertension, high cholesterol, diabetes.  She does report a history of reflux and indigestion.  She states that this is worse when she drinks caffeinated beverages and has been drinking a lot of these lately.  She states that her symptoms actually feel better when she avoids caffeine.      Past Medical History:  Diagnosis Date  . Migraines   . Migraines     There are no active problems to display for this patient.   Past Surgical History:  Procedure Laterality Date  . CESAREAN SECTION    . TUBAL LIGATION       OB History   None      Home Medications    Prior to Admission medications   Medication Sig Start Date End Date Taking? Authorizing Provider  CEFDINIR PO Take by mouth.   Yes  [provider]  cyclobenzaprine (FLEXERIL) 10 MG tablet Take 1 tablet (10 mg total) by mouth 2 (two) times daily as needed for muscle spasms. 06/15/16   Jacalyn Lefevre, MD  fexofenadine (ALLEGRA) 180 MG tablet Take 180 mg by mouth daily.    [provider]  Guaifenesin 1200 MG TB12 Take 1 tablet (1,200 mg total) by mouth 2 (two) times daily. 02/06/16   Lawyer, Cristal Deer, PA-C  ibuprofen (ADVIL,MOTRIN) 800 MG tablet Take 1 tablet (800 mg total) by mouth 3 (three) times daily. 04/20/17   Georgiana Shore, PA-C  methocarbamol (ROBAXIN) 500 MG tablet Take 1 tablet (500 mg total) by mouth 2 (two) times daily as needed for muscle spasms. 07/20/17   Tilden Fossa, MD  metroNIDAZOLE (FLAGYL) 500 MG tablet Take 1 tablet (500 mg total) by mouth 2 (two) times daily. 06/15/16   Jacalyn Lefevre, MD  metroNIDAZOLE (METROGEL) 0.75 % vaginal gel Place 1 Applicatorful vaginally at bedtime. For 5 days 06/15/16   Jacalyn Lefevre, MD  ondansetron (ZOFRAN ODT) 4 MG disintegrating tablet Take 1 tablet (4 mg total) by mouth every 8 (eight) hours as needed for nausea or vomiting. 11/16/15   Shaune Pollack, MD  potassium chloride SA (K-DUR,KLOR-CON) 20 MEQ tablet Take 1 tablet (20 mEq total) by mouth daily for 5 days. 04/20/17 04/25/17  Mathews Robinsons B, PA-C  promethazine-dextromethorphan (PROMETHAZINE-DM) 6.25-15 MG/5ML syrup Take 5 mLs by mouth 4 (four)  times daily as needed for cough. 02/06/16   Charlestine Night, PA-C    Family History History reviewed. No pertinent family history.  Social History Social History   Tobacco Use  . Smoking status: Current Every Day Smoker    Packs/day: 0.25    Types: Cigarettes  . Smokeless tobacco: Never Used  Substance Use Topics  . Alcohol use: Yes    Comment: occ yearly  . Drug use: No     Allergies   Levaquin [levofloxacin in d5w]   Review of Systems Review of Systems  Constitutional: Negative for diaphoresis and fever.  Eyes: Negative for  redness.  Respiratory: Positive for shortness of breath. Negative for cough.   Cardiovascular: Positive for chest pain. Negative for palpitations and leg swelling.  Gastrointestinal: Negative for abdominal pain, blood in stool, nausea and vomiting.  Genitourinary: Negative for dysuria.  Musculoskeletal: Negative for back pain and neck pain.  Skin: Negative for rash.  Neurological: Negative for syncope and light-headedness.  Psychiatric/Behavioral: The patient is not nervous/anxious.      Physical Exam Updated Vital Signs BP 122/76 (BP Location: Right Arm)   Pulse 83   Temp 98.3 F (36.8 C) (Oral)   Resp 18   Ht 4\' 11"  (1.499 m)   Wt 76.2 kg   LMP 12/01/2017 Comment: had tubal ligation/rr  SpO2 100%   BMI 33.93 kg/m   Physical Exam  Constitutional: She appears well-developed and well-nourished.  HENT:  Head: Normocephalic and atraumatic.  Mouth/Throat: Oropharynx is clear and moist and mucous membranes are normal. Mucous membranes are not dry.  Eyes: Conjunctivae are normal.  Neck: Trachea normal and normal range of motion. Neck supple. Normal carotid pulses and no JVD present. No muscular tenderness present. Carotid bruit is not present. No tracheal deviation present.  Cardiovascular: Normal rate, regular rhythm, S1 normal, S2 normal, normal heart sounds and intact distal pulses. Exam reveals no decreased pulses.  No murmur heard. Pulmonary/Chest: Effort normal. No respiratory distress. She has no wheezes. She exhibits tenderness.  Mild chest tenderness to anterior and lateral chest wall on the left.  No skin findings.  Abdominal: Soft. Normal aorta and bowel sounds are normal. There is no tenderness. There is no rebound and no guarding.  Musculoskeletal: Normal range of motion.  Neurological: She is alert.  Skin: Skin is warm and dry. She is not diaphoretic. No cyanosis. No pallor.  Psychiatric: She has a normal mood and affect.  Nursing note and vitals reviewed.    ED  Treatments / Results  Labs (all labs ordered are listed, but only abnormal results are displayed) Labs Reviewed  CBC  COMPREHENSIVE METABOLIC PANEL  LIPASE, BLOOD  TROPONIN I  D-DIMER, QUANTITATIVE (NOT AT [facility])   ED ECG REPORT   Date: 12/06/2017  Rate: 84  Rhythm: normal sinus rhythm  QRS Axis: normal  Intervals: normal  ST/T Wave abnormalities: nonspecific T wave changes  Conduction Disutrbances:none  Narrative Interpretation:   Old EKG Reviewed: unchanged from 07/2017  I have personally reviewed the EKG tracing and agree with the computerized printout as noted.  Radiology Dg Chest 2 View  Result Date: 12/06/2017 CLINICAL DATA:  Chest pain and back pain for 2 months, initial encounter EXAM: CHEST - 2 VIEW COMPARISON:  07/20/2017 FINDINGS: The heart size and mediastinal contours are within normal limits. Both lungs are clear. The visualized skeletal structures are unremarkable. IMPRESSION: No active cardiopulmonary disease. Electronically Signed   By: Alcide Clever M.D.   On: 12/06/2017 13:03  Procedures Procedures (including critical care time)  Medications Ordered in ED Medications - No data to display   Initial Impression / Assessment and Plan / ED Course  I have reviewed the triage vital signs and the nursing notes.  Pertinent labs & imaging results that were available during my care of the patient were reviewed by me and considered in my medical decision making (see chart for details).     Patient seen and examined.  EKG reviewed by myself.  Work-up initiated.  Patient appears well, no distress.  Vital signs reviewed and are as follows: BP 122/76 (BP Location: Right Arm)   Pulse 83   Temp 98.3 F (36.8 C) (Oral)   Resp 18   Ht 4\' 11"  (1.499 m)   Wt 76.2 kg   LMP 12/01/2017 Comment: had tubal ligation/rr  SpO2 100%   BMI 33.93 kg/m   2:54 PM reviewed all results with patient.  They are reassuring.  Patient will be discharged home with symptomatic  management including meloxicam, omeprazole.   The patient verbalized understanding and agreed.   Encouraged patient to return with worsening chest pain, shortness of breath, fever cough, new symptoms or other concerns.  Patient verbalizes understanding agrees with plan.   Final Clinical Impressions(s) / ED Diagnoses   Final diagnoses:  Left-sided chest wall pain   Patient with left-sided chest pain ongoing for months.  It is reproducible in nature.  Patient had complete work-up here today including EKG, chest x-ray, d-dimer, troponin.  Work-up was negative.  This may be chest wall pain as symptoms are reproducible with palpation and positioning.  She also reports having increased indigestion with caffeine use and may be she has an element of esophagitis or gastritis.  She will be treated for these.  ED Discharge Orders         Ordered    meloxicam (MOBIC) 7.5 MG tablet  Daily     12/06/17 1438    omeprazole (PRILOSEC) 20 MG capsule  Daily     12/06/17 1438           Renne Crigler, PA-C 12/06/17 1458    Vanetta Mulders, MD 12/07/17 [phone]

## 2017-12-06 NOTE — Discharge Instructions (Signed)
Please read and follow all provided instructions.  Your diagnoses today include:  1. Left-sided chest wall pain     Tests performed today include:  Blood counts and electrolytes  Blood tests to check liver and kidney function  Blood tests to check pancreas function  D-dimer test -does not suggest blood clot  Blood test for heart problems -was negative  Chest x-ray-did not show any pneumonia or other problems  Vital signs. See below for your results today.   Medications prescribed:   Meloxicam - anti-inflammatory pain medication  You have been prescribed an anti-inflammatory medication or NSAID. Take with food. Do not take aspirin, ibuprofen, or naproxen if taking this medication. Take smallest effective dose for the shortest duration needed for your pain. Stop taking if you experience stomach pain or vomiting.    Omeprazole (Prilosec) - stomach acid reducer  This medication can be found over-the-counter  Take any prescribed medications only as directed.  Home care instructions:   Follow any educational materials contained in this packet.  Follow-up instructions: Please follow-up with your primary care provider in the next 7 days for further evaluation of your symptoms.    Return instructions:  SEEK IMMEDIATE MEDICAL ATTENTION IF:  The pain does not go away or becomes severe   A temperature above 101F develops   Repeated vomiting occurs (multiple episodes)   The pain becomes localized to portions of the abdomen. The right side could possibly be appendicitis. In an adult, the left lower portion of the abdomen could be colitis or diverticulitis.   Blood is being passed in stools or vomit (bright red or black tarry stools)   You develop chest pain, difficulty breathing, dizziness or fainting, or become confused, poorly responsive, or inconsolable (young children)  If you have any other emergent concerns regarding your health  Your vital signs today were: BP  108/70 (BP Location: Left Arm)    Pulse 68    Temp 98.3 F (36.8 C) (Oral)    Resp 16    Ht 4\' 11"  (1.499 m)    Wt 76.2 kg    LMP 12/01/2017 Comment: had tubal ligation/rr   SpO2 100%    BMI 33.93 kg/m  If your blood pressure (bp) was elevated above 135/85 this visit, please have this repeated by your doctor within one month. --------------

## 2017-12-06 NOTE — ED Triage Notes (Signed)
Pt amb to triage with quick steady gait in nad. Pt reports left sided breast, rib, abd and mid and upper back pain. Denies sob or any other c/o. Denies injury or trauma, areas are tender to palp, pain increases with positions and movements, also increases with deep inspriration.

## 2018-01-07 ENCOUNTER — Emergency Department (HOSPITAL_BASED_OUTPATIENT_CLINIC_OR_DEPARTMENT_OTHER): Payer: 59

## 2018-01-07 ENCOUNTER — Other Ambulatory Visit: Payer: Self-pay

## 2018-01-07 ENCOUNTER — Encounter (HOSPITAL_BASED_OUTPATIENT_CLINIC_OR_DEPARTMENT_OTHER): Payer: Self-pay | Admitting: Emergency Medicine

## 2018-01-07 ENCOUNTER — Emergency Department (HOSPITAL_BASED_OUTPATIENT_CLINIC_OR_DEPARTMENT_OTHER)
Admission: EM | Admit: 2018-01-07 | Discharge: 2018-01-07 | Disposition: A | Discharge status: Home / Self Care | Payer: 59 | Attending: Emergency Medicine | Admitting: Emergency Medicine

## 2018-01-07 DIAGNOSIS — M546 Pain in thoracic spine: Secondary | ICD-10-CM | POA: Insufficient documentation

## 2018-01-07 DIAGNOSIS — M549 Dorsalgia, unspecified: Secondary | ICD-10-CM | POA: Diagnosis present

## 2018-01-07 DIAGNOSIS — F17210 Nicotine dependence, cigarettes, uncomplicated: Secondary | ICD-10-CM | POA: Diagnosis not present

## 2018-01-07 DIAGNOSIS — Z79899 Other long term (current) drug therapy: Secondary | ICD-10-CM | POA: Diagnosis not present

## 2018-01-07 DIAGNOSIS — G8929 Other chronic pain: Secondary | ICD-10-CM | POA: Diagnosis not present

## 2018-01-07 DIAGNOSIS — N644 Mastodynia: Secondary | ICD-10-CM | POA: Insufficient documentation

## 2018-01-07 DIAGNOSIS — R079 Chest pain, unspecified: Secondary | ICD-10-CM | POA: Diagnosis not present

## 2018-01-07 LAB — BASIC METABOLIC PANEL
ANION GAP: 8 (ref 5–15)
BUN: 10 mg/dL (ref 6–20)
CALCIUM: 9.8 mg/dL (ref 8.9–10.3)
CO2: 25 mmol/L (ref 22–32)
CREATININE: 0.67 mg/dL (ref 0.44–1.00)
Chloride: 107 mmol/L (ref 98–111)
GFR calc non Af Amer: >60 mL/min (ref >60)
Glucose, Bld: 82 mg/dL (ref 70–99)
Potassium: 3.7 mmol/L (ref 3.5–5.1)
SODIUM: 140 mmol/L (ref 135–145)

## 2018-01-07 LAB — CBC
HCT: 41.7 % (ref 36.0–46.0)
Hemoglobin: 13.5 g/dL (ref 12.0–15.0)
MCH: 30.7 pg (ref 26.0–34.0)
MCHC: 32.4 g/dL (ref 30.0–36.0)
MCV: 94.8 fL (ref 80.0–100.0)
NRBC: 0 % (ref 0.0–0.2)
Platelets: 209 10*3/uL (ref 150–400)
RBC: 4.4 MIL/uL (ref 3.87–5.11)
RDW: 11.7 % (ref 11.5–15.5)
WBC: 4 10*3/uL (ref 4.0–10.5)

## 2018-01-07 LAB — TROPONIN I: Troponin I: <0.03 ng/mL (ref <0.03)

## 2018-01-07 LAB — PREGNANCY, URINE: Preg Test, Ur: NEGATIVE

## 2018-01-07 MED ORDER — KETOROLAC TROMETHAMINE 15 MG/ML IJ SOLN
15.0000 mg | Freq: Once | INTRAMUSCULAR | Status: AC
  Administered 2018-01-07: 15 mg via INTRAVENOUS
  Filled 2018-01-07: qty 1

## 2018-01-07 MED ORDER — GABAPENTIN 300 MG PO CAPS: 300.0000 mg | ORAL_CAPSULE | Freq: Every day | ORAL | 0 refills | Status: AC

## 2018-01-07 NOTE — ED Provider Notes (Addendum)
[facility] EMERGENCY DEPARTMENT Provider Note   CSN: [id number] Arrival date & time: 01/07/18  0945     History   Chief Complaint Chief Complaint  Patient presents with  . Chest Pain    HPI Tara Cherry is a 32 y.o. female who presents with back pain and chest pain.  Past medical history significant for low back pain, migraines.  Patient states that she is had left mid back pain for the past several months.  She is been to the ED several times for this problem. At her last ED visit she had a normal EKG, CXR, labs, and was ruled out for PE with a negative d-dimer.  The back pain radiates around to the left side of her chest under her ribs. Pain in the front feels more like a pressure. She also has breast soreness. She feels like the pain is now radiating to her hip. It feels like a burning pain.  She has tried ibuprofen, tramadol, muscle relaxers without significant relief.  She states she has a history of low back pain which improved after after she did physical therapy but the pain she is having is higher up.  Sitting for long periods of time and lying on the left side make the pain worse.  It also seems to be worse at night.  Her PCP scheduled her for a mammogram since she has family history of breast cancer.  She has not felt any lumps in the breast.  No fever, constant shortness of breath, abdominal pain, vomiting.  HPI  Past Medical History:  Diagnosis Date  . Migraines   . Migraines     There are no active problems to display for this patient.   Past Surgical History:  Procedure Laterality Date  . CESAREAN SECTION    . TUBAL LIGATION       OB History   None      Home Medications    Prior to Admission medications   Medication Sig Start Date End Date Taking? Authorizing Provider  CEFDINIR PO Take by mouth.    [provider]  cyclobenzaprine (FLEXERIL) 10 MG tablet Take 1 tablet (10 mg total) by mouth 2 (two) times daily as needed for muscle  spasms. 06/15/16   Jacalyn Lefevre, MD  fexofenadine (ALLEGRA) 180 MG tablet Take 180 mg by mouth daily.    [provider]  gabapentin (NEURONTIN) 300 MG capsule Take 1 capsule (300 mg total) by mouth at bedtime. 01/07/18   Bethel Born, PA-C  Guaifenesin 1200 MG TB12 Take 1 tablet (1,200 mg total) by mouth 2 (two) times daily. 02/06/16   Lawyer, Cristal Deer, PA-C  ibuprofen (ADVIL,MOTRIN) 800 MG tablet Take 1 tablet (800 mg total) by mouth 3 (three) times daily. 04/20/17   Georgiana Shore, PA-C  meloxicam (MOBIC) 7.5 MG tablet Take 1 tablet (7.5 mg total) by mouth daily. 12/06/17   Renne Crigler, PA-C  methocarbamol (ROBAXIN) 500 MG tablet Take 1 tablet (500 mg total) by mouth 2 (two) times daily as needed for muscle spasms. 07/20/17   Tilden Fossa, MD  metroNIDAZOLE (FLAGYL) 500 MG tablet Take 1 tablet (500 mg total) by mouth 2 (two) times daily. 06/15/16   Jacalyn Lefevre, MD  metroNIDAZOLE (METROGEL) 0.75 % vaginal gel Place 1 Applicatorful vaginally at bedtime. For 5 days 06/15/16   Jacalyn Lefevre, MD  omeprazole (PRILOSEC) 20 MG capsule Take 1 capsule (20 mg total) by mouth daily. 12/06/17   Renne Crigler, PA-C  ondansetron [facility]  ODT) 4 MG disintegrating tablet Take 1 tablet (4 mg total) by mouth every 8 (eight) hours as needed for nausea or vomiting. 11/16/15   Shaune PollackIsaacs, Cameron, MD  potassium chloride SA (K-DUR,KLOR-CON) 20 MEQ tablet Take 1 tablet (20 mEq total) by mouth daily for 5 days. 04/20/17 04/25/17  Georgiana ShoreMitchell, Jessica B, PA-C  promethazine-dextromethorphan (PROMETHAZINE-DM) 6.25-15 MG/5ML syrup Take 5 mLs by mouth 4 (four) times daily as needed for cough. 02/06/16   Charlestine NightLawyer, Christopher, PA-C    Family History History reviewed. No pertinent family history.  Social History Social History   Tobacco Use  . Smoking status: Current Every Day Smoker    Packs/day: 0.25    Types: Cigarettes  . Smokeless tobacco: Never Used  Substance Use Topics  . Alcohol use: Yes     Comment: occ yearly  . Drug use: No     Allergies   Levaquin [levofloxacin in d5w]   Review of Systems Review of Systems  Constitutional: Negative for fever.  Respiratory: Positive for shortness of breath.   Cardiovascular: Positive for chest pain. Negative for palpitations and leg swelling.  Gastrointestinal: Positive for nausea. Negative for abdominal pain and vomiting.  Neurological: Positive for numbness.  All other systems reviewed and are negative.    Physical Exam Updated Vital Signs BP (!) 132/96   Pulse (!) 115   Temp 98.1 F (36.7 C) (Oral)   Resp 20   Ht 4\' 11"  (1.499 m)   Wt 75.8 kg   LMP 01/01/2018 (Exact Date)   SpO2 100%   BMI 33.73 kg/m   Physical Exam  Constitutional: She is oriented to person, place, and time. She appears well-developed and well-nourished. No distress.  Calm and cooperative  HENT:  Head: Normocephalic and atraumatic.  Eyes: Pupils are equal, round, and reactive to light. Conjunctivae are normal. Right eye exhibits no discharge. Left eye exhibits no discharge. No scleral icterus.  Neck: Normal range of motion.  Cardiovascular: Normal rate and regular rhythm.  Pulmonary/Chest: Effort normal and breath sounds normal. No respiratory distress.  Abdominal: Soft. Bowel sounds are normal. She exhibits no distension. There is no tenderness.  Musculoskeletal:  Thoracic back: No masses, deformity, or rash Palpation: No midline spinal tenderness. Left sided paraspinal muscle tenderness with diffuse left sided tenderness Strength: 5/5 in upper and lower extremities and normal plantar and dorsiflexion Sensation: Intact sensation with light touch in lower extremities bilaterally   Neurological: She is alert and oriented to person, place, and time.  Skin: Skin is warm and dry.  Psychiatric: She has a normal mood and affect. Her behavior is normal.  Nursing note and vitals reviewed.    ED Treatments / Results  Labs (all labs ordered are  listed, but only abnormal results are displayed) Labs Reviewed  BASIC METABOLIC PANEL  CBC  TROPONIN I  PREGNANCY, URINE    EKG EKG Interpretation  Date/Time:  Monday January 07 2018 09:55:15 EST Ventricular Rate:  116 PR Interval:    QRS Duration: 91 QT Interval:  309 QTC Calculation: 430 R Axis:   81 Text Interpretation:  Sinus tachycardia Right atrial enlargement Since last tracing rate faster Confirmed by Jacalyn LefevreHaviland, Julie [phone] on 01/07/2018 10:15:49 AM   Radiology Dg Chest 2 View  Result Date: 01/07/2018 CLINICAL DATA:  Chest pain EXAM: CHEST - 2 VIEW COMPARISON:  12/06/2017 FINDINGS: The heart size and mediastinal contours are within normal limits. Both lungs are clear. The visualized skeletal structures are unremarkable. IMPRESSION: No active cardiopulmonary disease. Electronically Signed  By: Marlan Palau M.D.   On: 01/07/2018 10:38    Procedures Procedures (including critical care time)  Medications Ordered in ED Medications  ketorolac (TORADOL) 15 MG/ML injection 15 mg (15 mg Intravenous Given 01/07/18 1104)     Initial Impression / Assessment and Plan / ED Course  I have reviewed the triage vital signs and the nursing notes.  Pertinent labs & imaging results that were available during my care of the patient were reviewed by me and considered in my medical decision making (see chart for details).  32 year old female presents with worsening of her chronic mid back pain which radiates to the left chest.  Initially she is mildly hypertensive and tachycardic.  This has resolved without any intervention.  EKG is sinus tachycardia.  Labs are normal.  Chest x-ray is negative.  On exam she has tenderness over the left thoracic back.  Since she has been seen for this problem before and this is chronic pain, will not expand work-up at this time.  She is scheduled for a mammogram of her breast by her PCP which I think is prudent.  Pain could possibly be due to  radiculopathy.  Encouraged her to follow-up with her doctor for possible MRI of her thoracic spine. She was given rx for Gabapentin. Return precautions given.  Final Clinical Impressions(s) / ED Diagnoses   Final diagnoses:  Chronic left-sided thoracic back pain  Breast pain, left    ED Discharge Orders         Ordered    gabapentin (NEURONTIN) 300 MG capsule  Daily at bedtime     01/07/18 1055               Bethel Born, PA-C 01/07/18 1226    Jacalyn Lefevre, MD 01/07/18 [phone]

## 2018-01-07 NOTE — ED Triage Notes (Signed)
Reports left sided chest pain which began 4 days ago.  Additionally reports left mid back pain x 3 months.  Reports shortness of breath when laying down.  Reports nausea.

## 2018-01-07 NOTE — Discharge Instructions (Signed)
Continue Ibuprofen as needed for pain Take Gabapentin once before bedtime for nerve pain Try a lidocaine patch (which is over the counter) on the back Please follow up with your doctor Return if you are worsening

## 2020-08-24 IMAGING — CR DG CHEST 2V
2 series · 2 of 2 positions shown · non-contrast
Comparison: 12/06/2017

CLINICAL DATA: Chest pain

EXAM:
CHEST - 2 VIEW

[w chest pa]
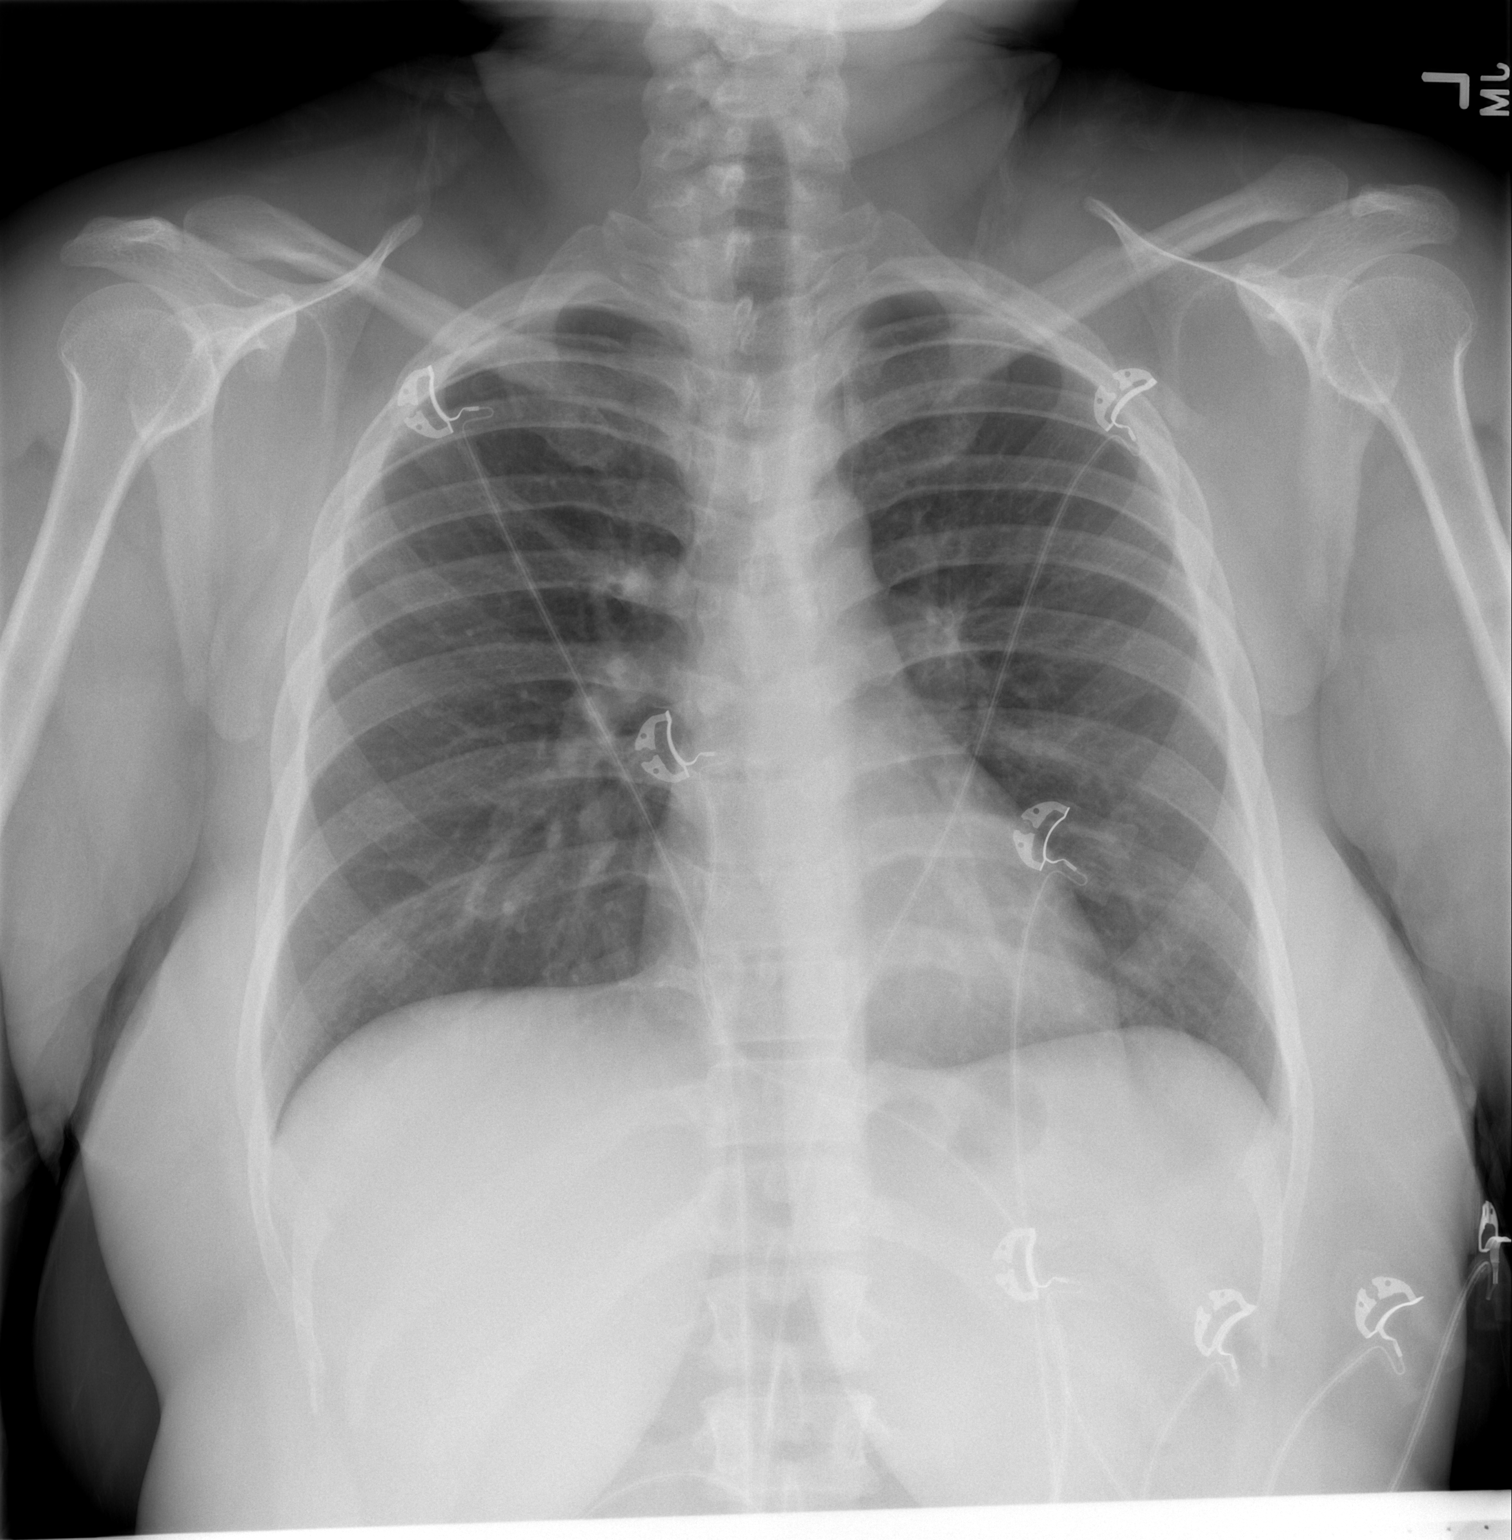

[w chest lat]
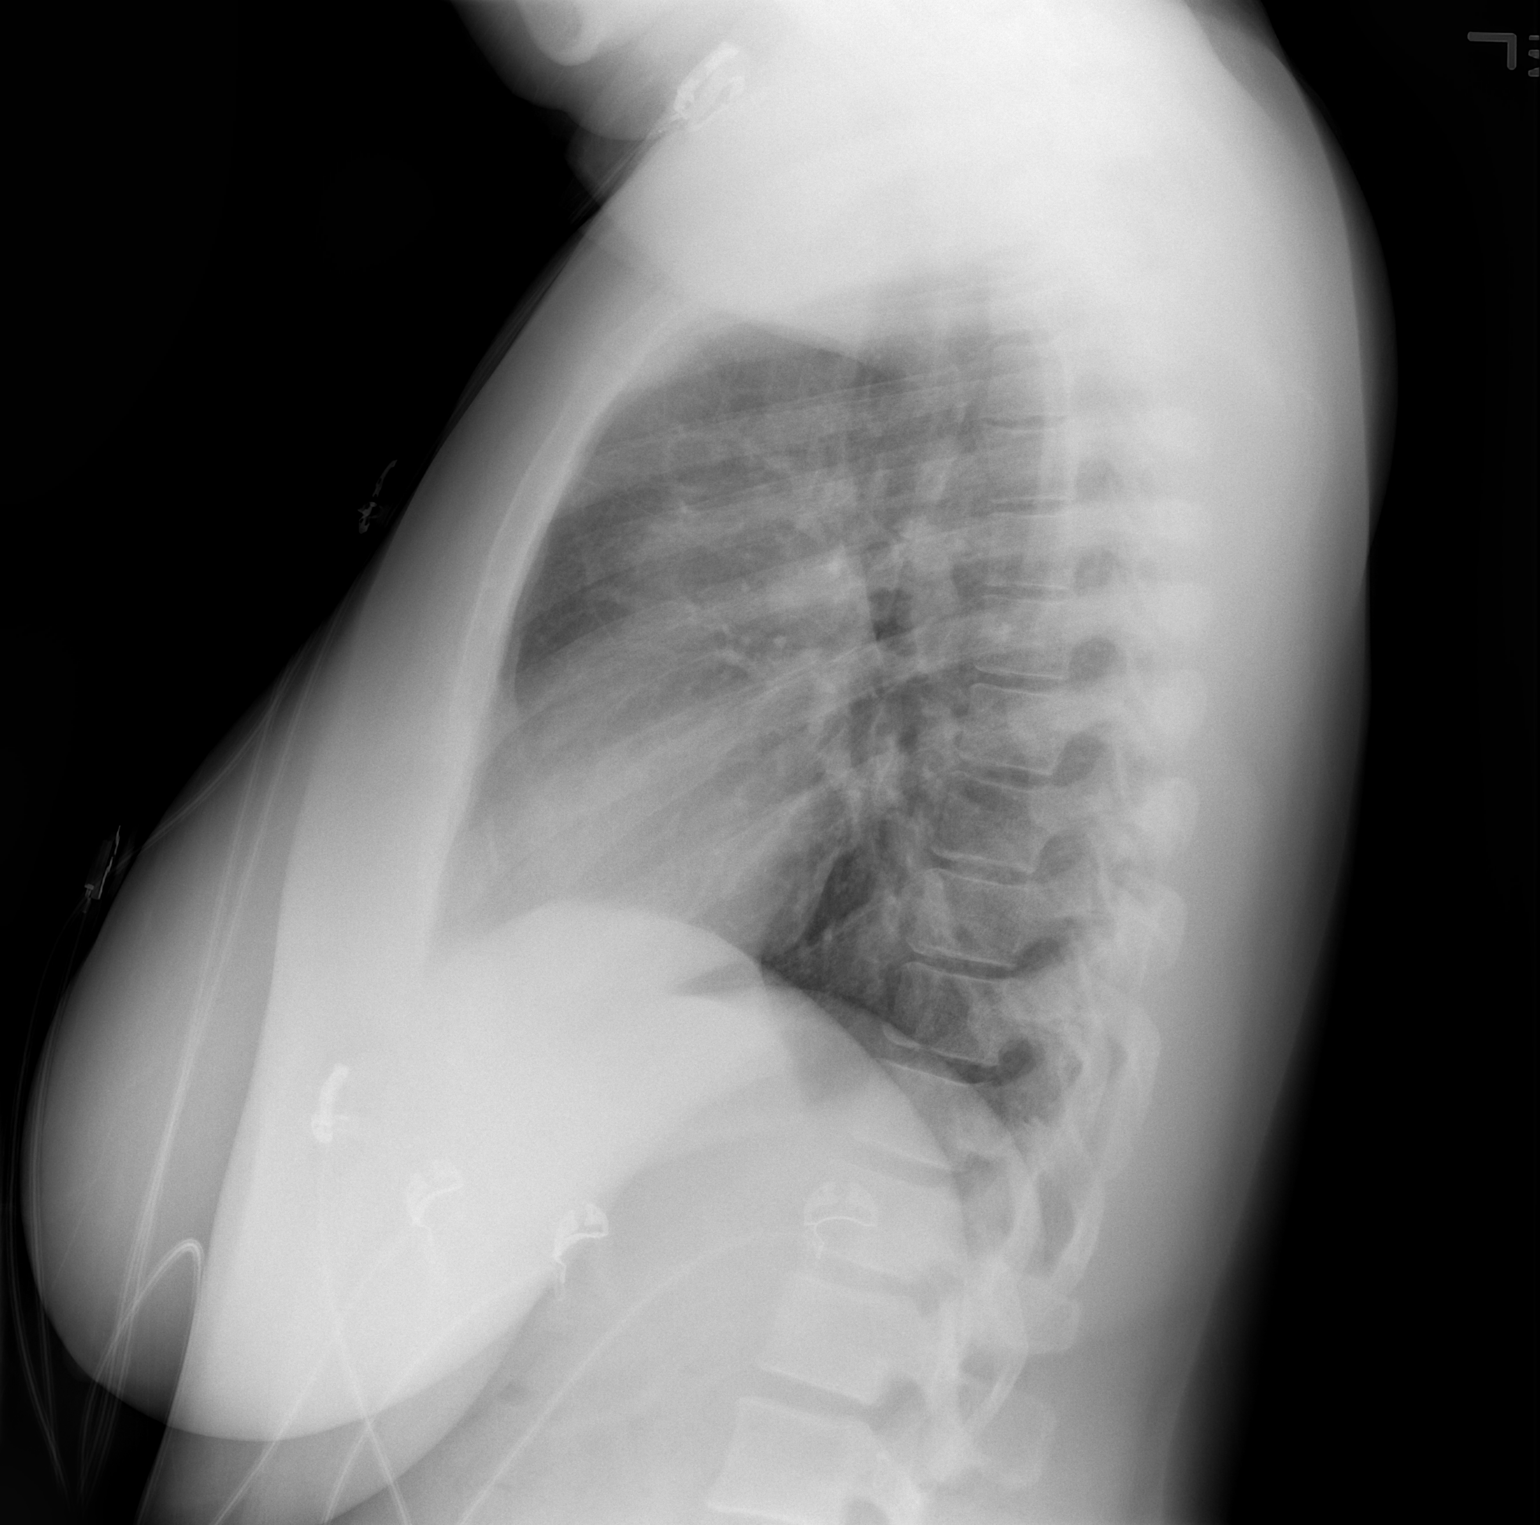

[2 of 2 positions shown; findings below may reference images not displayed]

FINDINGS: The heart size and mediastinal contours are within normal limits.
Both lungs are clear. The visualized skeletal structures are
unremarkable.
IMPRESSION: No active cardiopulmonary disease.

## 2024-03-13 ENCOUNTER — Emergency Department (HOSPITAL_BASED_OUTPATIENT_CLINIC_OR_DEPARTMENT_OTHER)
Admission: EM | Admit: 2024-03-13 | Discharge: 2024-03-13 | Disposition: A | Discharge status: Home / Self Care | Payer: Self-pay | Attending: Emergency Medicine | Admitting: Emergency Medicine

## 2024-03-13 ENCOUNTER — Other Ambulatory Visit (HOSPITAL_BASED_OUTPATIENT_CLINIC_OR_DEPARTMENT_OTHER): Payer: Self-pay

## 2024-03-13 ENCOUNTER — Other Ambulatory Visit: Payer: Self-pay

## 2024-03-13 ENCOUNTER — Encounter (HOSPITAL_BASED_OUTPATIENT_CLINIC_OR_DEPARTMENT_OTHER): Payer: Self-pay

## 2024-03-13 DIAGNOSIS — M545 Low back pain, unspecified: Secondary | ICD-10-CM | POA: Insufficient documentation

## 2024-03-13 DIAGNOSIS — X503XXA Overexertion from repetitive movements, initial encounter: Secondary | ICD-10-CM | POA: Insufficient documentation

## 2024-03-13 DIAGNOSIS — R350 Frequency of micturition: Secondary | ICD-10-CM | POA: Insufficient documentation

## 2024-03-13 LAB — URINALYSIS, ROUTINE W REFLEX MICROSCOPIC
Bilirubin Urine: NEGATIVE
Glucose, UA: NEGATIVE mg/dL
Hgb urine dipstick: NEGATIVE
Ketones, ur: NEGATIVE mg/dL
Leukocytes,Ua: NEGATIVE
Nitrite: NEGATIVE
Protein, ur: NEGATIVE mg/dL
Specific Gravity, Urine: 1.02 (ref 1.005–1.030)
pH: 6.5 (ref 5.0–8.0)

## 2024-03-13 LAB — PREGNANCY, URINE: Preg Test, Ur: NEGATIVE

## 2024-03-13 MED ORDER — CYCLOBENZAPRINE HCL 10 MG PO TABS: 10.0000 mg | ORAL_TABLET | Freq: Two times a day (BID) | ORAL | 0 refills | Status: AC | PRN

## 2024-03-13 MED ORDER — CYCLOBENZAPRINE HCL 10 MG PO TABS
10.0000 mg | ORAL_TABLET | Freq: Two times a day (BID) | ORAL | 0 refills | Status: DC | PRN
  Filled 2024-03-13: qty 20, 10d supply, fill #0

## 2024-03-13 MED ORDER — METHYLPREDNISOLONE 4 MG PO TBPK
ORAL_TABLET | ORAL | 0 refills | Status: DC
  Filled 2024-03-13: qty 21, 6d supply, fill #0

## 2024-03-13 MED ORDER — METHYLPREDNISOLONE 4 MG PO TBPK: ORAL_TABLET | ORAL | 0 refills | Status: AC

## 2024-03-13 NOTE — Discharge Instructions (Signed)
 Recommend starting your steroid Dosepak when you get home.  I prescribed you a muscle relaxant called cyclobenzaprine .  This medication is sedating so please be careful with its use.  Do not drive or do any dangerous activities while taking this medicine.  I recommend 1000 mg Tylenol every 6 hours as needed for pain.  Follow-up with primary care.  Return if symptoms worsen.

## 2024-03-13 NOTE — ED Notes (Signed)

## 2024-03-13 NOTE — ED Provider Notes (Signed)
 " [name] Provider Note   CSN: [id number] Arrival date & time: 03/13/24  1049     Patient presents with: Back Pain   Tara Cherry is a 39 y.o. female.   Patient here with low back pain and urinary frequency.  Denies any discharge bleeding weakness numbness tingling.  No trauma.  She does sit a lot for her work as she works from home.  She denies any loss of bowel or bladder otherwise.  Denies any chest pain shortness of breath.  She feels tightness in her back for the last week or 2.  Movement makes it worse.  But she is also concerned for maybe urinary tract infection.  The history is provided by the patient.       Prior to Admission medications  Medication Sig Start Date End Date Taking? Authorizing Provider  cyclobenzaprine  (FLEXERIL ) 10 MG tablet Take 1 tablet (10 mg total) by mouth 2 (two) times daily as needed for muscle spasms. 03/13/24  Yes Zayan Delvecchio, DO  methylPREDNISolone  (MEDROL  DOSEPAK) 4 MG TBPK tablet Follow package insert 03/13/24  Yes Fatma Rutten, DO  CEFDINIR PO Take by mouth.    [provider]  fexofenadine (ALLEGRA) 180 MG tablet Take 180 mg by mouth daily.    [provider]  gabapentin  (NEURONTIN ) 300 MG capsule Take 1 capsule (300 mg total) by mouth at bedtime. 01/07/18   Odis Burnard Jansky, PA-C  Guaifenesin  1200 MG TB12 Take 1 tablet (1,200 mg total) by mouth 2 (two) times daily. 02/06/16   Lawyer, Lonni, PA-C  ibuprofen  (ADVIL ,MOTRIN ) 800 MG tablet Take 1 tablet (800 mg total) by mouth 3 (three) times daily. 04/20/17   Merilee Raisin B, PA-C  meloxicam  (MOBIC ) 7.5 MG tablet Take 1 tablet (7.5 mg total) by mouth daily. 12/06/17   Geiple, Joshua, PA-C  methocarbamol  (ROBAXIN ) 500 MG tablet Take 1 tablet (500 mg total) by mouth 2 (two) times daily as needed for muscle spasms. 07/20/17   Griselda Norris, MD  metroNIDAZOLE  (FLAGYL ) 500 MG tablet Take 1 tablet (500 mg total) by mouth 2  (two) times daily. 06/15/16   Dean Clarity, MD  metroNIDAZOLE  (METROGEL ) 0.75 % vaginal gel Place 1 Applicatorful vaginally at bedtime. For 5 days 06/15/16   Dean Clarity, MD  omeprazole  (PRILOSEC) 20 MG capsule Take 1 capsule (20 mg total) by mouth daily. 12/06/17   Desiderio Chew, PA-C  ondansetron  (ZOFRAN  ODT) 4 MG disintegrating tablet Take 1 tablet (4 mg total) by mouth every 8 (eight) hours as needed for nausea or vomiting. 11/16/15   Angelena Smalls, MD  potassium chloride  SA (K-DUR,KLOR-CON ) 20 MEQ tablet Take 1 tablet (20 mEq total) by mouth daily for 5 days. 04/20/17 04/25/17  Merilee Raisin B, PA-C  promethazine -dextromethorphan (PROMETHAZINE -DM) 6.25-15 MG/5ML syrup Take 5 mLs by mouth 4 (four) times daily as needed for cough. 02/06/16   Lawyer, Lonni, PA-C    Allergies: Levaquin [levofloxacin in d5w] and Sulfa  antibiotics    Review of Systems  Updated Vital Signs BP (!) 136/97 (BP Location: Right Arm)   Pulse (!) 113   Temp 98.4 F (36.9 C)   Resp 16   Wt 71.7 kg   SpO2 100%   BMI 31.91 kg/m   Physical Exam Vitals and nursing note reviewed.  Constitutional:      General: She is not in acute distress.    Appearance: She is well-developed. She is not ill-appearing.  HENT:     Head: Normocephalic  and atraumatic.     Nose: Nose normal.     Mouth/Throat:     Mouth: Mucous membranes are moist.  Eyes:     Extraocular Movements: Extraocular movements intact.     Conjunctiva/sclera: Conjunctivae normal.     Pupils: Pupils are equal, round, and reactive to light.  Cardiovascular:     Rate and Rhythm: Normal rate and regular rhythm.     Pulses: Normal pulses.     Heart sounds: Normal heart sounds. No murmur heard. Pulmonary:     Effort: Pulmonary effort is normal. No respiratory distress.     Breath sounds: Normal breath sounds.  Abdominal:     Palpations: Abdomen is soft.     Tenderness: There is no abdominal tenderness.  Musculoskeletal:        General:  Tenderness present. No swelling.     Cervical back: Normal range of motion and neck supple.     Comments: Tenderness to paraspinal lumbar muscles bilaterally with increased tone no midline spinal tenderness  Skin:    General: Skin is warm and dry.     Capillary Refill: Capillary refill takes less than 2 seconds.  Neurological:     General: No focal deficit present.     Mental Status: She is alert and oriented to person, place, and time.     Cranial Nerves: No cranial nerve deficit.     Sensory: No sensory deficit.     Motor: No weakness.     Coordination: Coordination normal.     Comments: Normal strength normal sensation  Psychiatric:        Mood and Affect: Mood normal.     (all labs ordered are listed, but only abnormal results are displayed) Labs Reviewed  URINALYSIS, ROUTINE W REFLEX MICROSCOPIC  PREGNANCY, URINE    EKG: None  Radiology: No results found.   Procedures   Medications Ordered in the ED - No data to display                                  Medical Decision Making Amount and/or Complexity of Data Reviewed Labs: ordered.  Risk Prescription drug management.   Ceanna Wareing is here with back pain.  Normal vitals.  No fever.  Tenderness to the low back with increased tone suggestive of muscle spasm.  She has no trauma history.  I doubt any compression fracture or traumatic process.  She has no symptoms suggest cauda equina.  No fever.  Doubt any infectious process.  But she is having some urinary frequency and we will check for urinary tract infection.  No concern for any discharge or any pelvic pain or any other acute process.  Will check for urine infection if unremarkable will treat for muscle spasms back spasms with Medrol  Dosepak and Flexeril .  Her main concern is for UTI.  She is neurovascular neuromuscular intact on exam.  Normal strength and sensation.  Urinalysis negative.  Pregnancy test negative.  I do suspect muscle spasms.  Will prescribe  Medrol  Dosepak and cyclobenzaprine .  Please return if symptoms worsen.  Otherwise follow-up with your primary care doctor.  Discharge.  This chart was dictated using voice recognition software.  Despite best efforts to proofread,  errors can occur which can change the documentation meaning.      Final diagnoses:  Acute low back pain, unspecified back pain laterality, unspecified whether sciatica present    ED Discharge Orders  Ordered    methylPREDNISolone  (MEDROL  DOSEPAK) 4 MG TBPK tablet        03/13/24 1120    cyclobenzaprine  (FLEXERIL ) 10 MG tablet  2 times daily PRN        03/13/24 1120               CuratoloJuliene, DO 03/13/24 1121  "

## 2024-03-13 NOTE — ED Triage Notes (Signed)
 Low/mid back pain x 2 weeks. Burning/tightness to area. Denies injury Endorses urinary frequency Reports chills, but no fever
